# Patient Record
Sex: Female | Born: 1990
Health system: Southern US, Community
[De-identification: ages and names within clinical notes are randomized; demographics above are authoritative.]

## PROBLEM LIST (undated history)

## (undated) ENCOUNTER — Inpatient Hospital Stay (HOSPITAL_COMMUNITY): Payer: Self-pay

## (undated) DIAGNOSIS — M419 Scoliosis, unspecified: Secondary | ICD-10-CM

---

## 2013-12-09 ENCOUNTER — Emergency Department (HOSPITAL_BASED_OUTPATIENT_CLINIC_OR_DEPARTMENT_OTHER)
Admission: EM | Admit: 2013-12-09 | Discharge: 2013-12-09 | Disposition: A | Discharge status: Home / Self Care | Payer: Medicaid - Out of State | Attending: Emergency Medicine | Admitting: Emergency Medicine

## 2013-12-09 ENCOUNTER — Emergency Department (HOSPITAL_BASED_OUTPATIENT_CLINIC_OR_DEPARTMENT_OTHER): Payer: Medicaid - Out of State

## 2013-12-09 ENCOUNTER — Encounter (HOSPITAL_BASED_OUTPATIENT_CLINIC_OR_DEPARTMENT_OTHER): Payer: Self-pay | Admitting: Emergency Medicine

## 2013-12-09 DIAGNOSIS — J111 Influenza due to unidentified influenza virus with other respiratory manifestations: Secondary | ICD-10-CM

## 2013-12-09 DIAGNOSIS — IMO0001 Reserved for inherently not codable concepts without codable children: Secondary | ICD-10-CM | POA: Diagnosis not present

## 2013-12-09 DIAGNOSIS — R509 Fever, unspecified: Secondary | ICD-10-CM | POA: Diagnosis present

## 2013-12-09 MED ORDER — OSELTAMIVIR PHOSPHATE 75 MG PO CAPS: 75.0000 mg | ORAL_CAPSULE | Freq: Two times a day (BID) | ORAL | Status: DC

## 2013-12-09 MED ORDER — KETOROLAC TROMETHAMINE 60 MG/2ML IM SOLN
INTRAMUSCULAR | Status: AC
  Filled 2013-12-09: qty 2

## 2013-12-09 MED ORDER — KETOROLAC TROMETHAMINE 30 MG/ML IJ SOLN
60.0000 mg | Freq: Once | INTRAMUSCULAR | Status: AC
  Administered 2013-12-09: 60 mg via INTRAMUSCULAR

## 2013-12-09 MED ORDER — IBUPROFEN 600 MG PO TABS: 600.0000 mg | ORAL_TABLET | Freq: Four times a day (QID) | ORAL | Status: DC | PRN

## 2013-12-09 MED ORDER — ACETAMINOPHEN 325 MG PO TABS
650.0000 mg | ORAL_TABLET | Freq: Once | ORAL | Status: AC
  Administered 2013-12-09: 650 mg via ORAL
  Filled 2013-12-09: qty 2

## 2013-12-09 NOTE — ED Provider Notes (Signed)
CSN: [id number]     Arrival date & time 12/09/13  1330 History   First MD Initiated Contact with Patient 12/09/13 1412     Chief Complaint  Patient presents with  . Fever   (Consider location/radiation/quality/duration/timing/severity/associated sxs/prior Treatment) Patient is a 23 y.o. female presenting with fever. The history is provided by the patient.  Fever Max temp prior to arrival:  102.6 Temp source:  Oral Severity:  Moderate Onset quality:  Sudden Timing:  Constant Progression:  Worsening Chronicity:  New Relieved by:  Nothing Worsened by:  Nothing tried Associated symptoms: myalgias   Associated symptoms: no cough and no vomiting     History reviewed. No pertinent past medical history. History reviewed. No pertinent past surgical history. No family history on file. History  Substance Use Topics  . Smoking status: Never Smoker   . Smokeless tobacco: Not on file  . Alcohol Use: No   OB History   Grav Para Term Preterm Abortions TAB SAB Ect Mult Living                 Review of Systems  Constitutional: Positive for fever.  Respiratory: Negative for cough and shortness of breath.   Gastrointestinal: Negative for vomiting and abdominal pain.  Musculoskeletal: Positive for myalgias.  All other systems reviewed and are negative.    Allergies  Review of patient's allergies indicates no known allergies.  Home Medications   Current Outpatient Rx  Name  Route  Sig  Dispense  Refill  . ibuprofen (ADVIL,MOTRIN) 600 MG tablet   Oral   Take 1 tablet (600 mg total) by mouth every 6 (six) hours as needed.   30 tablet   0   . oseltamivir (TAMIFLU) 75 MG capsule   Oral   Take 1 capsule (75 mg total) by mouth every 12 (twelve) hours.   10 capsule   0    BP 109/66  Pulse 113  Temp(Src) 102.6 F (39.2 C) (Oral)  Resp 18  Ht 5\' 3"  (1.6 m)  Wt 120 lb (54.432 kg)  BMI 21.26 kg/m2  SpO2 100% Physical Exam  Nursing note and vitals reviewed. Constitutional:  She is oriented to person, place, and time. She appears well-developed and well-nourished. No distress.  HENT:  Head: Normocephalic and atraumatic.  Right Ear: Tympanic membrane normal.  Left Ear: Tympanic membrane normal.  Eyes: EOM are normal. Pupils are equal, round, and reactive to light.  Neck: Normal range of motion. Neck supple.  Cardiovascular: Normal rate and regular rhythm.  Exam reveals no friction rub.   No murmur heard. Pulmonary/Chest: Effort normal and breath sounds normal. No respiratory distress. She has no wheezes. She has no rales.  Abdominal: Soft. She exhibits no distension. There is no tenderness. There is no rebound.  Musculoskeletal: Normal range of motion. She exhibits no edema.  Neurological: She is alert and oriented to person, place, and time.  Skin: She is not diaphoretic.    ED Course  Procedures (including critical care time) Labs Review Labs Reviewed - No data to display Imaging Review Dg Chest 2 View  12/09/2013   CLINICAL DATA:  Cough, sore throat, fever  EXAM: CHEST  2 VIEW  COMPARISON:  None  FINDINGS: Normal cardiac silhouette and mediastinal contours. The lungs appear mildly hyperexpanded. No focal airspace opacities. No pleural effusion or pneumothorax. No evidence of edema. No acute osseus abnormalities.  IMPRESSION: Mild lung hyperexpansion without acute cardiopulmonary disease.   Electronically Signed   By: Simonne Come  M.D.   On: 12/09/2013 14:49    EKG Interpretation   None       MDM   1. Influenza    SUBJECTIVE:  Emily Bennett is a 23 y.o. female who present complaining of flu-like symptoms: fevers, chills, myalgias, congestion, sore throat and cough for 1 days. Denies dyspnea or wheezing.  OBJECTIVE: Appears moderately ill but not toxic; temperature as noted in vitals. Ears normal. Throat and pharynx normal.  Neck supple. No adenopathy in the neck. Sinuses non tender. The chest is clear.  ASSESSMENT: Influenza  PLAN: Tamiflu -  CXR negative for PNA Symptomatic therapy suggested: rest, increase fluids, OTC acetaminophen, ibuprofen and call prn if symptoms persist or worsen. Call or return to clinic prn if these symptoms worsen or fail to improve as anticipated.     Dagmar HaitWilliam Fern Canova, MD 12/09/13 1536

## 2013-12-09 NOTE — ED Notes (Signed)
MD at bedside. 

## 2013-12-09 NOTE — Discharge Instructions (Signed)
Influenza, Adult °Influenza ("the flu") is a viral infection of the respiratory tract. It occurs more often in winter months because people spend more time in close contact with one another. Influenza can make you feel very sick. Influenza easily spreads from person to person (contagious). °CAUSES  °Influenza is caused by a virus that infects the respiratory tract. You can catch the virus by breathing in droplets from an infected person's cough or sneeze. You can also catch the virus by touching something that was recently contaminated with the virus and then touching your mouth, nose, or eyes. °SYMPTOMS  °Symptoms typically last 4 to 10 days and may include: °· Fever. °· Chills. °· Headache, body aches, and muscle aches. °· Sore throat. °· Chest discomfort and cough. °· Poor appetite. °· Weakness or feeling tired. °· Dizziness. °· Nausea or vomiting. °DIAGNOSIS  °Diagnosis of influenza is often made based on your history and a physical exam. A nose or throat swab test can be done to confirm the diagnosis. °RISKS AND COMPLICATIONS °You may be at risk for a more severe case of influenza if you smoke cigarettes, have diabetes, have chronic heart disease (such as heart failure) or lung disease (such as asthma), or if you have a weakened immune system. Elderly people and pregnant women are also at risk for more serious infections. The most common complication of influenza is a lung infection (pneumonia). Sometimes, this complication can require emergency medical care and may be life-threatening. °PREVENTION  °An annual influenza vaccination (flu shot) is the best way to avoid getting influenza. An annual flu shot is now routinely recommended for all adults in the [place] °TREATMENT  °In mild cases, influenza goes away on its own. Treatment is directed at relieving symptoms. For more severe cases, your caregiver may prescribe antiviral medicines to shorten the sickness. Antibiotic medicines are not effective, because the  infection is caused by a virus, not by bacteria. °HOME CARE INSTRUCTIONS °· Only take over-the-counter or prescription medicines for pain, discomfort, or fever as directed by your caregiver. °· Use a cool mist humidifier to make breathing easier. °· Get plenty of rest until your temperature returns to normal. This usually takes 3 to 4 days. °· Drink enough fluids to keep your urine clear or pale yellow. °· Cover your mouth and nose when coughing or sneezing, and wash your hands well to avoid spreading the virus. °· Stay home from work or school until your fever has been gone for at least 1 full day. °SEEK MEDICAL CARE IF:  °· You have chest pain or a deep cough that worsens or produces more mucus. °· You have nausea, vomiting, or diarrhea. °SEEK IMMEDIATE MEDICAL CARE IF:  °· You have difficulty breathing, shortness of breath, or your skin or nails turn bluish. °· You have severe neck pain or stiffness. °· You have a severe headache, facial pain, or earache. °· You have a worsening or recurring fever. °· You have nausea or vomiting that cannot be controlled. °MAKE SURE YOU: °· Understand these instructions. °· Will watch your condition. °· Will get help right away if you are not doing well or get worse. °Document Released: 11/09/2000 Document Revised: 05/13/2012 Document Reviewed: 02/11/2012 °ExitCare® Patient Information ©2014 [employer]. ° ° °Emergency Department Resource Guide °1) Find a Doctor and Pay Out of Pocket °Although you won't have to find out who is covered by your insurance plan, it is a good idea to ask around and get recommendations. You will then need to   call the office and see if the doctor you have chosen will accept you as a new patient and what types of options they offer for patients who are self-pay. Some doctors offer discounts or will set up payment plans for their patients who do not have insurance, but you will need to ask so you aren't surprised when you get to your appointment. ° °2)  Contact Your Local Health Department °Not all health departments have doctors that can see patients for sick visits, but many do, so it is worth a call to see if yours does. If you don't know where your local health department is, you can check in your phone book. The CDC also has a tool to help you locate your state's health department, and many state websites also have listings of all of their local health departments. ° °3) Find a Walk-in Clinic °If your illness is not likely to be very severe or complicated, you may want to try a walk in clinic. These are popping up all over the country in pharmacies, drugstores, and shopping centers. They're usually staffed by nurse practitioners or physician assistants that have been trained to treat common illnesses and complaints. They're usually fairly quick and inexpensive. However, if you have serious medical issues or chronic medical problems, these are probably not your best option. ° °No Primary Care Doctor: °- Call [facility] at  832-8000 - they can help you locate a primary care doctor that  accepts your insurance, provides certain services, etc. °- Physician Referral Service- 1-800-533-3463 ° °Chronic Pain Problems: °Organization         Address  Phone   Notes  °[facility]  (336) 297-2271 Patients need to be referred by their primary care doctor.  ° °Medication Assistance: °Organization         Address  Phone   Notes  °[place] [facility] [address] °[place], [place] [address] (336) 641-8030 --Must be a resident of [place] °-- Must have NO insurance coverage whatsoever (no Medicaid/ Medicare, etc.) °-- The pt. MUST have a primary care doctor that directs their care regularly and follows them in the community °  °[facility]  (866) 331-1348   °[employer]  (888) 892-1162   ° °Agencies that provide inexpensive medical care: °Organization         Address  Phone   Notes  °[facility]   (336) 832-8035   °[facility]    (336) 832-7272   °[facility] [address] °[place], [place] [address] (336) 832-4777   °[facility] [address], °[place] (336) 271-4999   °[facility]    (336) 373-0678   °[facility]    (336) 272-1050   °[facility] ° [address], [place] Phone:  (336) 832-4444, Fax:  (336) 832-4440 Hours of Operation:  9 am - 6 pm, M-F.  Also accepts Medicaid/Medicare and self-pay.  °[facility] ° [address], [place] Phone: (336) 832-3150, Fax: (336) 832-3151. Hours of Operation:  8:30 am - 5:30 pm, M-F.  Also accepts Medicaid and self-pay.  °[facility] [place] [address], [place] Phone: (336) 878-6027   °[facility] [address], [place], [place] (336)723-1848, Ext. 123 Mondays & Thursdays: 7-9 AM.  First 15 patients are seen on a first come, first serve basis. °  ° °  Medicaid-accepting [place] Providers: ° °Organization         Address  Phone   Notes  °[facility] [address], [place] (336) 641-2100 Also accepts self-pay patients.  °[facility] [address], [place] ° (336) 856-9996   °[facility] [address], [place] (336) 288-8857   °[facility] [address] [place] [address], [place] (336) 299-7000   °Veita Bland [address], [place]  ° (336) 373-1557 Only accepts [place] Access Medicaid patients after they have their name applied to their card.  ° °Self-Pay (no insurance) in [place]: ° °Organization         Address  Phone   Notes  °Sickle Cell Patients, [facility] [address], [place] (336) 832-1970   °[facility] [address], [place] (336) 832-4400   ° [facility] ° [address], [place] (336) 992-4800   °[facility]/Dr. Osei-Bonsu ° [address] [place] [address], [place] or [address], [place] (336) 841-8500 Phone number for both [place] and [place] locations is the same.  °[facility] [address], [place] (336) 299-0000   °[facility] [address] [place] [address], [place] or [address] (336) 852-7530 °(336) 878-2260   °[facility] [address], [place] (336) 350-1642, phone; (336) 294-5005, fax Sees patients 1st and 3rd Saturday of every month.  Must not qualify for public or private insurance (i.e. Medicaid, Medicare, [place] Health Choice, Veterans' Benefits) • Household income should be no more than 200% of the poverty level •The clinic cannot treat you if you are pregnant or think you are pregnant • Sexually transmitted diseases are not treated at the clinic.  ° ° °Dental Care: °Organization         Address  Phone  Notes  °[facility] [address], [place] (336) 641-6152 Accepts children up to age 21 who are enrolled in Medicaid or [place] Health Choice; pregnant women with a Medicaid card; and children who have applied for Medicaid or [place] Health Choice, but were declined, whose parents can pay a reduced fee at time of service.  °[facility] [place]  [address], [place] (336) 641-7733 Accepts children up to age 21 who are enrolled in Medicaid or [place] Health Choice; pregnant women with a Medicaid card; and children who have applied for Medicaid or  Health Choice, but were declined, whose parents can pay a reduced fee at time of service.  °[facility] ° [address], [place] (336) 641-4533 Patients are seen by appointment only. Walk-ins are not accepted. [facility] will see patients 18 years of age and older. °Monday - Tuesday  (8am-5pm) °Most Wednesdays (8:30-5pm) °$30 per visit, cash only  °[facility] ° [address], [place] (336) 641-4533 Patients are seen by appointment only. Walk-ins are not accepted. [facility] will see patients 18 years of age and older. °One Wednesday Evening (Monthly: Volunteer Based).  $30 per visit, cash only  °[facility]  (919) 537-3737 for adults; Children under age 4, call Graduate Pediatric Dentistry at (919) [phone]. Children aged 4-14, please call (919) [phone] to request a   pediatric application. ° Dental services are provided in all areas of dental care including fillings, crowns and bridges, complete and partial dentures, implants, gum treatment, root canals, and extractions. Preventive care is also provided. Treatment is provided to both adults and children. °Patients are selected via a lottery and there is often a waiting list. °  °[facility] [address], °[place] ° (336) 763-8833 [email or web address] °  °[facility] [address], [place], [place] (336)723-1848, Ext. 123 Second and Fourth Thursday of each month, opens at 6:30 AM; Clinic ends at 9 AM.  Patients are seen on a first-come first-served basis, and a limited number are seen during each clinic.  ° °[facility] ° [address], [place], [place] (336) 723-7904   Eligibility Requirements °You must have lived in [place], [place], or [place] counties for at least the last three months. °  You cannot be eligible for state or federal sponsored healthcare insurance, including Veterans Administration, Medicaid, or Medicare. °  You generally cannot be eligible for healthcare insurance through your employer.  °  How to apply: °Eligibility screenings are held every Tuesday and Wednesday afternoon from 1:00 pm until 4:00 pm. You do not need an appointment for the interview!  °[facility] [address], [place], [place]  336-631-2330   °[facility]  336-342-8273   °[facility]  336-703-3100   °[facility]  336-570-6415   ° °Behavioral Health Resources in the Community: °Intensive Outpatient Programs °Organization         Address  Phone  Notes  °[facility] [address], [place], [place] 336-878-6098   °[facility] [address], [place], [place] 336-832-9800   °[facility] [address], [place], [place] ° 336-882-2125   °[facility] [address],  °[place], [place] 1-800-853-5163 or 336-641-4981   °Substance Abuse Resources °Organization         Address  Phone  Notes  °[facility]  336-882-2125   °[facility]  336-784-9470   °[facility]  336-285-9073   °[facility]  336-845-3988   °Residential & Outpatient Substance Abuse Program  1-800-659-3381   °Psychological Services °Organization         Address  Phone  Notes  °[facility]  336- 832-9600   °[facility]  336- 378-7881   °[facility] [address], [place] 1-800-853-5163 or 336-641-4981   ° °Mobile Crisis Teams °Organization         Address  Phone  Notes  °[facility], Mobile Crisis Care Unit  1-877-626-1772   °Assertive °[facility] ° [address] [place], [place] 336-834-9664   °Sharon DeEsch [address] °[place] 336-554-5454   ° °Self-Help/Support Groups °Organization         Address  Phone             Notes  °[facility] - variety of support groups  336- 373-1402 Call for more information  °Narcotics Anonymous (NA), [facility] [address], °[place]  2 meetings at this location  ° °Residential Treatment Programs °Organization         Address  Phone  Notes  °[facility] [address],    °[place]   1-866-801-8205   °[facility] ° [address], [place], [place] 704-293-8524   °[facility] [address], [place] 336-845-3988 Admissions: 8am-3pm M-F  °[facility] [address]    °[place], [place] 336-841-1104   °[facility] [address], [place], [place] 336-379-7146   °[facility] [address]  °[place], [place] 336-285-9073   °[facility] Intensive Outpatient [address], [place], [place] 336-852-3033   °[facility] [address]  °[place], [place] 1-877-615-2722 or 336-784-9470   °[facility] [address] [place], [place] 336-227-7417 Accepts Medicaid  °[facility] [address]  °[place] 1-800-659-3381 Substance Abuse/Addiction Treatment  ° °[place] Behavioral Health Resources °Organization         Address  Phone  Notes  °[facility]  (888) 581-9988   °Julie Brannon, PhD [address] [place], [place]   (336) 349-5553 or (336) 951-0000   °[facility]   [address] °[place], [place] (336) 349-4454   °[facility] [address], [place], [place] (336) 342-8316 Insurance/Medicaid/sponsorship through [facility]  °[facility] [address]                                    [place], [place] (336) 342-8316 Therapy/tele-psych/case  °[facility] [address]  ° [place], [place] (336) 349-2233    °Dr. Arfeen  (336) 349-4544   °[facility] [place]  [employer] [place] [facility] 1) [address], [place] °2) [address], [place] °3)  [address], [place] (336) 349-3220 °(336) 342-7768 ° °(336) 342-8140   °[place] [facility] (336) 342-1394 or (336) 342-3537 (After Hours)    ° ° ° °

## 2013-12-09 NOTE — ED Notes (Signed)
Fever, sore throat, cough, right leg pain

## 2014-05-08 ENCOUNTER — Emergency Department (HOSPITAL_BASED_OUTPATIENT_CLINIC_OR_DEPARTMENT_OTHER)
Admission: EM | Admit: 2014-05-08 | Discharge: 2014-05-08 | Disposition: A | Discharge status: Home / Self Care | Payer: Medicaid - Out of State | Attending: Emergency Medicine | Admitting: Emergency Medicine

## 2014-05-08 ENCOUNTER — Encounter (HOSPITAL_BASED_OUTPATIENT_CLINIC_OR_DEPARTMENT_OTHER): Payer: Self-pay | Admitting: Emergency Medicine

## 2014-05-08 DIAGNOSIS — K089 Disorder of teeth and supporting structures, unspecified: Secondary | ICD-10-CM | POA: Diagnosis not present

## 2014-05-08 DIAGNOSIS — Z792 Long term (current) use of antibiotics: Secondary | ICD-10-CM | POA: Insufficient documentation

## 2014-05-08 DIAGNOSIS — G8918 Other acute postprocedural pain: Secondary | ICD-10-CM | POA: Diagnosis not present

## 2014-05-08 DIAGNOSIS — T85848A Pain due to other internal prosthetic devices, implants and grafts, initial encounter: Secondary | ICD-10-CM

## 2014-05-08 MED ORDER — TRAMADOL HCL 50 MG PO TABS: 50.0000 mg | ORAL_TABLET | Freq: Once | ORAL | Status: DC

## 2014-05-08 MED ORDER — PENICILLIN V POTASSIUM 250 MG PO TABS
500.0000 mg | ORAL_TABLET | Freq: Once | ORAL | Status: AC
  Administered 2014-05-08: 500 mg via ORAL
  Filled 2014-05-08: qty 2

## 2014-05-08 MED ORDER — TRAMADOL HCL 50 MG PO TABS
50.0000 mg | ORAL_TABLET | Freq: Once | ORAL | Status: AC
  Administered 2014-05-08: 50 mg via ORAL
  Filled 2014-05-08: qty 1

## 2014-05-08 MED ORDER — PENICILLIN V POTASSIUM 500 MG PO TABS: 500.0000 mg | ORAL_TABLET | Freq: Four times a day (QID) | ORAL | Status: DC

## 2014-05-08 NOTE — ED Provider Notes (Signed)
Medical screening examination/treatment/procedure(s) were performed by non-physician practitioner and as supervising physician I was immediately available for consultation/collaboration.     Geoffery Lyonsouglas Elice Crigger, MD 05/08/14 [phone]

## 2014-05-08 NOTE — ED Notes (Signed)
C/o right upper wisdom tooth discomfort for several days.

## 2014-05-08 NOTE — Discharge Instructions (Signed)
You have a  partially erupted right bottom wisdom tooth with surrounding gum swelling, and tenderness.  You have  been placed on an antibiotic.  Please take this as directed 4 times a day for 10, days.  You've also been given Ultram that, you can take in addition to ibuprofen for pain, control.  You've also been given a referral to a dentist.  Please call first thing on Monday morning telling them that you were referred through the emergency department.  We will make every effort to see, you in a timely manner

## 2014-05-08 NOTE — ED Provider Notes (Signed)
CSN: [id number]     Arrival date & time 05/08/14  2112 History   First MD Initiated Contact with Patient 05/08/14 2120     Chief Complaint  Patient presents with  . Dental Pain     (Consider location/radiation/quality/duration/timing/severity/associated sxs/prior Treatment) HPI Comments: Patient has a partially erupted wisdom tooth right lower that has been bothering her for several days.  Been taking over-the-counter ibuprofen, without relief  Patient is a 23 y.o. female presenting with tooth pain. The history is provided by the patient.  Dental Pain Location:  Lower Lower teeth location:  32/RL 3rd molar Quality:  Aching Severity:  Moderate Timing:  Constant Associated symptoms: no fever     History reviewed. No pertinent past medical history. History reviewed. No pertinent past surgical history. No family history on file. History  Substance Use Topics  . Smoking status: Never Smoker   . Smokeless tobacco: Not on file  . Alcohol Use: No   OB History   Grav Para Term Preterm Abortions TAB SAB Ect Mult Living                 Review of Systems  Constitutional: Negative for fever.  HENT: Positive for dental problem.   Skin: Negative for rash and wound.  All other systems reviewed and are negative.     Allergies  Review of patient's allergies indicates no known allergies.  Home Medications   Prior to Admission medications   Medication Sig Start Date End Date Taking? Authorizing Provider  ibuprofen (ADVIL,MOTRIN) 600 MG tablet Take 1 tablet (600 mg total) by mouth every 6 (six) hours as needed. 12/09/13   Dagmar HaitWilliam Blair Walden, MD  oseltamivir (TAMIFLU) 75 MG capsule Take 1 capsule (75 mg total) by mouth every 12 (twelve) hours. 12/09/13   Dagmar HaitWilliam Blair Walden, MD  penicillin v potassium (VEETID) 500 MG tablet Take 1 tablet (500 mg total) by mouth 4 (four) times daily. 05/08/14   Arman FilterGail K Eliya Bubar, NP  traMADol (ULTRAM) 50 MG tablet Take 1 tablet (50 mg total) by mouth  once. 05/08/14   Arman FilterGail K Shaylea Ucci, NP   BP 145/83  Pulse 80  Temp(Src) 98.7 F (37.1 C) (Oral)  Resp 16  Ht 5\' 3"  (1.6 m)  Wt 128 lb (58.06 kg)  BMI 22.68 kg/m2  SpO2 100%  LMP 04/07/2014 Physical Exam  Nursing note and vitals reviewed. Constitutional: She appears well-developed and well-nourished.  HENT:  Mouth/Throat:    Eyes: Pupils are equal, round, and reactive to light.  Neck: Normal range of motion.  Cardiovascular: Normal rate.   Pulmonary/Chest: Effort normal.  Lymphadenopathy:    She has no cervical adenopathy.  Skin: Skin is warm.    ED Course  Procedures (including critical care time) Labs Review Labs Reviewed - No data to display  Imaging Review No results found.   EKG Interpretation None      MDM  Patient is a partially erupted wisdom tooth with surrounding erythema.  Started on penicillin and Ultram for pain, control.  She's also been given a referral to the dentist with instructions to call first thing Monday morning to set an appointment Final diagnoses:  Dental implant pain         Arman FilterGail K Taylin Leder, NP 05/08/14 2206

## 2014-05-17 ENCOUNTER — Emergency Department (HOSPITAL_COMMUNITY)
Admission: EM | Admit: 2014-05-17 | Discharge: 2014-05-17 | Disposition: A | Discharge status: Home / Self Care | Payer: Medicaid - Out of State | Attending: Emergency Medicine | Admitting: Emergency Medicine

## 2014-05-17 DIAGNOSIS — Z79899 Other long term (current) drug therapy: Secondary | ICD-10-CM | POA: Insufficient documentation

## 2014-05-17 DIAGNOSIS — K089 Disorder of teeth and supporting structures, unspecified: Secondary | ICD-10-CM | POA: Diagnosis present

## 2014-05-17 DIAGNOSIS — R110 Nausea: Secondary | ICD-10-CM | POA: Insufficient documentation

## 2014-05-17 DIAGNOSIS — Z792 Long term (current) use of antibiotics: Secondary | ICD-10-CM | POA: Diagnosis not present

## 2014-05-17 DIAGNOSIS — K0889 Other specified disorders of teeth and supporting structures: Secondary | ICD-10-CM

## 2014-05-17 MED ORDER — IBUPROFEN 800 MG PO TABS: 800.0000 mg | ORAL_TABLET | Freq: Three times a day (TID) | ORAL | Status: DC

## 2014-05-17 NOTE — ED Notes (Signed)
Declined W/C at D/C and was escorted to lobby by RN. 

## 2014-05-17 NOTE — Discharge Instructions (Signed)
Dental Pain °Toothache is pain in or around a tooth. It may get worse with chewing or with cold or heat.  °HOME CARE °· Your dentist may use a numbing medicine during treatment. If so, you may need to avoid eating until the medicine wears off. Ask your dentist about this. °· Only take medicine as told by your dentist or doctor. °· Avoid chewing food near the painful tooth until after all treatment is done. Ask your dentist about this. °GET HELP RIGHT AWAY IF:  °· The problem gets worse or new problems appear. °· You have a fever. °· There is redness and puffiness (swelling) of the face, jaw, or neck. °· You cannot open your mouth. °· There is pain in the jaw. °· There is very bad pain that is not helped by medicine. °MAKE SURE YOU:  °· Understand these instructions. °· Will watch your condition. °· Will get help right away if you are not doing well or get worse. °Document Released: 04/30/2008 Document Revised: 02/04/2012 Document Reviewed: 04/30/2008 °ExitCare® Patient Information ©2015 [employer]. This information is not intended to replace advice given to you by your health care provider. Make sure you discuss any questions you have with your health care provider. ° °Return to the emergency room for fever, change in vision, redness to the face that rapidly spreads towards the eye, nausea or vomiting, difficulty swallowing or shortness of breath. ° °You may follow with the dentist listed above. Alternatively,  you may also contact David and Janna Civils who run a low-cost dental clinic at their phone number is [phone]. You may also call [phone] ° °Dental Assistance °If the dentist on-call cannot see you, please use the resources below: ° ° °Patients with Medicaid: [facility] °[address], [phone] °[address], [phone] ° °If unable to pay, or uninsured, contact [facility] ([phone]) or [facility] ([phone] in [place], [phone] in  [place]) to become qualified for the adult dental clinic ° °Other Low-Cost Community Dental Services: °[facility]- [address], [place], [place], [address] °   [phone], Ext. [phone] °   2nd and 4th Thursday of the month at 6:30am °   10 clients each day by appointment, can sometimes see walk-in     patients if someone does not show for an appointment °[facility]- [address], [place], [place], [address] °   [phone] °[facility]- [address], [place], [place], [address] °   [phone] ° °[facility]- [phone] °[facility]- [phone] °[facility]- [phone] ° °

## 2014-05-17 NOTE — ED Provider Notes (Signed)
CSN: [id number]     Arrival date & time 05/17/14  1732 History  This chart was scribed for non-physician practitioner, Junious SilkHannah Merrell PA-C, working with Glynn OctaveStephen Rancour, MD by Milly JakobJohn Lee Graves, ED Scribe. The patient was seen in room TR04C/TR04C. Patient's care was started at 7:29 PM.   Chief Complaint  Patient presents with  . Dental Pain   The history is provided by the patient and a relative. No language interpreter was used.   HPI Comments: Emily Bennett is a 23 y.o. female who presents to the Emergency Department complaining of constant, aching right lower dental pain. She rates the pain a 9/10. Patient states that she has an impacted wisdom tooth for which she has been taking PCN and Tramadol. Patient reports taking Ibuprofen 600 mg with little relief. She states that she took Ibuprofen 800mg  with relief. Her relative reports that she became nauseated after taking Tramadol. The patient states that she would like pain medication, and requests Ibuprofen 800mg  instead of narcotics to avoid nausea.  She denies associated fever, chills, and vomiting. She reports she has been unable to see a dentist due to a lack of health insurance.   No past medical history on file. No past surgical history on file. No family history on file. History  Substance Use Topics  . Smoking status: Never Smoker   . Smokeless tobacco: Not on file  . Alcohol Use: No   OB History   Grav Para Term Preterm Abortions TAB SAB Ect Mult Living                 Review of Systems  Constitutional: Negative for fever and chills.  HENT: Positive for dental problem (right lower).   Gastrointestinal: Positive for nausea. Negative for vomiting.  All other systems reviewed and are negative.     Allergies  Review of patient's allergies indicates no known allergies.  Home Medications   Prior to Admission medications   Medication Sig Start Date End Date Taking? Authorizing Camey Edell  ibuprofen (ADVIL,MOTRIN) 600 MG  tablet Take 1 tablet (600 mg total) by mouth every 6 (six) hours as needed. 12/09/13   Dagmar HaitWilliam Blair Walden, MD  oseltamivir (TAMIFLU) 75 MG capsule Take 1 capsule (75 mg total) by mouth every 12 (twelve) hours. 12/09/13   Dagmar HaitWilliam Blair Walden, MD  penicillin v potassium (VEETID) 500 MG tablet Take 1 tablet (500 mg total) by mouth 4 (four) times daily. 05/08/14   Arman FilterGail K Schulz, NP  traMADol (ULTRAM) 50 MG tablet Take 1 tablet (50 mg total) by mouth once. 05/08/14   Arman FilterGail K Schulz, NP   Triage Vitals: BP 104/57  Pulse 77  Temp(Src) 99 F (37.2 C) (Oral)  Resp 18  SpO2 98%  LMP 04/07/2014 Physical Exam  Nursing note and vitals reviewed. Constitutional: She is oriented to person, place, and time. She appears well-developed and well-nourished. No distress.  HENT:  Head: Normocephalic and atraumatic.  Right Ear: External ear normal.  Left Ear: External ear normal.  Nose: Nose normal.  Mouth/Throat: Oropharynx is clear and moist.  No trismus, submental edema, or tongue elevation. Right lower wisdom tooth erupting, no surrounding erythema or inflammation of gum.   Eyes: Conjunctivae are normal.  Neck: Normal range of motion.  Cardiovascular: Normal rate, regular rhythm and normal heart sounds.   Pulmonary/Chest: Effort normal and breath sounds normal. No stridor. No respiratory distress. She has no wheezes. She has no rales.  Abdominal: Soft. She exhibits no distension.  Musculoskeletal: Normal  range of motion.  Neurological: She is alert and oriented to person, place, and time. She has normal strength.  Skin: Skin is warm and dry. She is not diaphoretic. No erythema.  Psychiatric: She has a normal mood and affect. Her behavior is normal.    ED Course  Procedures (including critical care time) DIAGNOSTIC STUDIES: Oxygen Saturation is 98% on room air, normal by my interpretation.    COORDINATION OF CARE: 7:35 PM-Discussed treatment plan which includes Ibuprofen 800  with pt at bedside and  pt agreed to plan. Discussed dental care options with patient.   Labs Review Labs Reviewed - No data to display  Imaging Review No results found.   EKG Interpretation None      MDM   Final diagnoses:  Pain, dental    Patient with toothache.  No gross abscess.  Exam unconcerning for Ludwig's angina or spread of infection.  Encouraged patient to continue taking PCN and she was given an rx for Ibuprofen 800mg .  Urged patient to follow-up with dentist.  Dental resource guide given.   I personally performed the services described in this documentation, which was scribed in my presence. The recorded information has been reviewed and is accurate.    Mora BellmanHannah S Merrell, PA-C 05/17/14 2144

## 2014-05-17 NOTE — ED Notes (Signed)
[place] dental care but doesn't have coverage. Pt states that her R bottom wisdom tooth is hurting 9/10 ache. Worse if pushed on.

## 2014-05-17 NOTE — ED Provider Notes (Signed)
Medical screening examination/treatment/procedure(s) were performed by non-physician practitioner and as supervising physician I was immediately available for consultation/collaboration.   EKG Interpretation None        Glynn OctaveStephen Rancour, MD 05/17/14 [phone]

## 2015-06-21 ENCOUNTER — Encounter (HOSPITAL_BASED_OUTPATIENT_CLINIC_OR_DEPARTMENT_OTHER): Payer: Self-pay | Admitting: *Deleted

## 2015-06-21 ENCOUNTER — Emergency Department (HOSPITAL_BASED_OUTPATIENT_CLINIC_OR_DEPARTMENT_OTHER)
Admission: EM | Admit: 2015-06-21 | Discharge: 2015-06-21 | Disposition: A | Discharge status: Home / Self Care | Payer: PRIVATE HEALTH INSURANCE | Attending: Emergency Medicine | Admitting: Emergency Medicine

## 2015-06-21 DIAGNOSIS — H109 Unspecified conjunctivitis: Secondary | ICD-10-CM | POA: Insufficient documentation

## 2015-06-21 DIAGNOSIS — Z791 Long term (current) use of non-steroidal anti-inflammatories (NSAID): Secondary | ICD-10-CM | POA: Insufficient documentation

## 2015-06-21 MED ORDER — CIPROFLOXACIN HCL 0.3 % OP SOLN
1.0000 [drp] | OPHTHALMIC | Status: DC
  Administered 2015-06-21: 1 [drp] via OPHTHALMIC
  Filled 2015-06-21: qty 2.5

## 2015-06-21 MED ORDER — ARTIFICIAL TEARS OP OINT
TOPICAL_OINTMENT | OPHTHALMIC | Status: DC | PRN
  Administered 2015-06-21: 12:00:00 via OPHTHALMIC
  Filled 2015-06-21: qty 3.5

## 2015-06-21 NOTE — ED Notes (Signed)
Patient states five days ago, she developed bilateral itching in her eyes.  States it felt like something was in her eyes.  States she has also had intermittent crusted mucus for the last three days.  Has used otc natural tears and redness reducer.  Denies any vision changes.

## 2015-06-21 NOTE — ED Provider Notes (Signed)
CSN: [id number]     Arrival date & time 06/21/15  1142 History   First MD Initiated Contact with Patient 06/21/15 1149     Chief Complaint  Patient presents with  . Eye Problem     (Consider location/radiation/quality/duration/timing/severity/associated sxs/prior Treatment) Patient is a 24 y.o. female presenting with eye problem. The history is provided by the patient.  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 She noticed on Friday she started feeling itchy medicine scratchiness of her left eye which continually worsened and then 2 days ago moved into her right eye as well Relieved by:  Nothing Worsened by:  Nothing tried Ineffective treatments: Allergy eyedrops. Associated symptoms: crusting, foreign body sensation, inflammation, itching and redness   Associated symptoms: no blurred vision, no decreased vision, no photophobia and no swelling     History reviewed. No pertinent past medical history. History reviewed. No pertinent past surgical history. No family history on file. History  Substance Use Topics  . Smoking status: Never Smoker   . Smokeless tobacco: Not on file  . Alcohol Use: No   OB History    No data available     Review of Systems  Eyes: Positive for redness and itching. Negative for blurred vision and photophobia.  All other systems reviewed and are negative.     Allergies  Review of patient's allergies indicates no known allergies.  Home Medications   Prior to Admission medications   Medication Sig Start Date End Date Taking? Authorizing Provider  ibuprofen (ADVIL,MOTRIN) 800 MG tablet Take 800 mg by mouth every 8 (eight) hours as needed for moderate  pain.    Historical Provider, MD  ibuprofen (ADVIL,MOTRIN) 800 MG tablet Take 1 tablet (800 mg total) by mouth 3 (three) times daily. 05/17/14   Junious Silk, PA-C  penicillin v potassium (VEETID) 500 MG tablet Take 500 mg by mouth 4 (four) times daily. Started course of medication two weeks ago from today 05-17-14 05/08/14   Earley Favor, NP  traMADol (ULTRAM) 50 MG tablet Take 50 mg by mouth once. 05/08/14   Earley Favor, NP   BP 111/60 mmHg  Pulse 80  Temp(Src) 98.7 F (37.1 C) (Oral)  Resp 18  Ht 5\' 3"  (1.6 m)  Wt 130 lb (58.968 kg)  BMI 23.03 kg/m2  SpO2 99%  LMP 05/28/2015 Physical Exam  Constitutional: She is oriented to person, place, and time. She appears well-developed and well-nourished. No distress.  HENT:  Head: Normocephalic and atraumatic.  Eyes: EOM are normal. Pupils are equal, round, and reactive to light. Right eye exhibits discharge. Right eye exhibits no exudate. Left eye exhibits discharge. Left eye exhibits no exudate. Right conjunctiva is injected. Right conjunctiva has no hemorrhage. Left conjunctiva is injected. Left conjunctiva has no hemorrhage. Pupils are equal.  Tearing present in both eyes but no discharge or matting currently  Cardiovascular: Normal rate.   Pulmonary/Chest: Effort normal.  Neurological: She is alert and oriented to person, place, and time.  Skin: Skin is warm and dry.  Psychiatric: She has a normal mood and affect. Her behavior is normal.  Nursing note and vitals reviewed.   ED Course  Procedures (including critical care time)  Labs Review Labs Reviewed - No data to display  Imaging Review No results found.   EKG Interpretation None      MDM   Final diagnoses:  Bilateral conjunctivitis    Patient with evidence of uncomplicated bilateral bacterial conjunctivitis. She is a noncontact user and has normal vision. She was treated with Cipro eyedrops and artificial tears.    Gwyneth Sprout, MD 06/21/15 1200

## 2015-06-21 NOTE — Discharge Instructions (Signed)

## 2015-06-21 NOTE — ED Notes (Signed)
D/c home with eyes drops given with instructions for home use

## 2017-11-08 ENCOUNTER — Emergency Department (HOSPITAL_BASED_OUTPATIENT_CLINIC_OR_DEPARTMENT_OTHER)
Admission: EM | Admit: 2017-11-08 | Discharge: 2017-11-08 | Disposition: A | Discharge status: Home / Self Care | Payer: PRIVATE HEALTH INSURANCE | Attending: Emergency Medicine | Admitting: Emergency Medicine

## 2017-11-08 ENCOUNTER — Encounter (HOSPITAL_BASED_OUTPATIENT_CLINIC_OR_DEPARTMENT_OTHER): Payer: Self-pay | Admitting: *Deleted

## 2017-11-08 ENCOUNTER — Other Ambulatory Visit: Payer: Self-pay

## 2017-11-08 DIAGNOSIS — H1013 Acute atopic conjunctivitis, bilateral: Secondary | ICD-10-CM

## 2017-11-08 DIAGNOSIS — R067 Sneezing: Secondary | ICD-10-CM | POA: Insufficient documentation

## 2017-11-08 DIAGNOSIS — R0981 Nasal congestion: Secondary | ICD-10-CM | POA: Insufficient documentation

## 2017-11-08 MED ORDER — CETIRIZINE HCL 10 MG PO TABS: 10.0000 mg | ORAL_TABLET | Freq: Every day | ORAL | 0 refills | Status: DC

## 2017-11-08 MED ORDER — FLUTICASONE PROPIONATE 50 MCG/ACT NA SUSP: 1.0000 | Freq: Every day | NASAL | 0 refills | Status: DC

## 2017-11-08 MED ORDER — CARBOXYMETHYLCELLULOSE SODIUM 1 % OP SOLN: 1.0000 [drp] | Freq: Three times a day (TID) | OPHTHALMIC | 12 refills | Status: DC

## 2017-11-08 MED FILL — FLUTICASONE PROP 50 MCG SPR: 50 | 60 days supply | Qty: 16 | Fill #0

## 2017-11-08 MED FILL — CETIRIZINE HCL 10 MG TABS: 10 | 100 days supply | Qty: 100 | Fill #0

## 2017-11-08 NOTE — ED Triage Notes (Signed)
Both eyes are red, drainage, and itching.

## 2017-11-08 NOTE — ED Notes (Signed)
ED Provider at bedside. 

## 2017-11-08 NOTE — Discharge Instructions (Signed)
Take cetirizine and Flonase daily. Use eyedrops up to 3 times a day as needed for eye irritation.  Do not touch the dropper to the eye, as this can cause spread of infection. Try not to itch, irritate, or touch your eyes. Wash your hands frequently to prevent spread of infection. Follow-up with the eye doctor next week if your symptoms are not improving. Return to the emergency room if you develop fevers, worsening symptoms, pain with movement of your eyes, vision changes, or any new or concerning symptoms.

## 2017-11-08 NOTE — ED Provider Notes (Signed)
[facility] EMERGENCY DEPARTMENT Provider Note   CSN: [id number] Arrival date & time: 11/08/17  1113     History   Chief Complaint Chief Complaint  Patient presents with  . Eye Problem    HPI Emily Bennett is a 26 y.o. female presenting with 3-day history of eye redness and irritation.  Patient states that for the past several days, she has had eye irritation.  It began in the right eye, and soon after was in the left eye.  She reports symptoms are worse on the right.  Her eyes are red, itchy, and feel "heavy."  She denies foreign body in her eye.  She denies vision changes.  She reports associated nasal congestion and sneezing.  She has a history of seasonal allergies, she does not take anything for this.  She is tried over-the-counter eyedrops without improvement of her symptoms.  She states that when she woke up, her eyes were crusted shut, but they are not draining throughout the day.  She denies fevers, chills, ear pain, sinus pain or pressure, sore throat, cough, chest pain, shortness of breath.  She has no other medical problems, does not take medications daily.  She denies sick contacts (including pinkeye). She does not wear contacts or glasses.   HPI  History reviewed. No pertinent past medical history.  There are no active problems to display for this patient.   History reviewed. No pertinent surgical history.  OB History    No data available       Home Medications    Prior to Admission medications   Medication Sig Start Date End Date Taking? Authorizing Provider  carboxymethylcellulose 1 % ophthalmic solution Apply 1 drop to eye 3 (three) times daily. 11/08/17   Dmetrius Ambs, PA-C  cetirizine (ZYRTEC) 10 MG tablet Take 1 tablet (10 mg total) by mouth daily. 11/08/17   Wilfrid Hyser, PA-C  fluticasone (FLONASE) 50 MCG/ACT nasal spray Place 1 spray into both nostrils daily. 11/08/17   Ephriam Turman, PA-C  ibuprofen (ADVIL,MOTRIN) 800 MG  tablet Take 800 mg by mouth every 8 (eight) hours as needed for moderate pain.    [provider]  ibuprofen (ADVIL,MOTRIN) 800 MG tablet Take 1 tablet (800 mg total) by mouth 3 (three) times daily. 05/17/14   Junious SilkMerrell, Hannah, PA-C  penicillin v potassium (VEETID) 500 MG tablet Take 500 mg by mouth 4 (four) times daily. Started course of medication two weeks ago from today 05-17-14 05/08/14   Earley FavorSchulz, Gail, NP  traMADol (ULTRAM) 50 MG tablet Take 50 mg by mouth once. 05/08/14   Earley FavorSchulz, Gail, NP    Family History No family history on file.  Social History Social History   Tobacco Use  . Smoking status: Never Smoker  . Smokeless tobacco: Never Used  Substance Use Topics  . Alcohol use: No  . Drug use: No     Allergies   Patient has no known allergies.   Review of Systems Review of Systems  Constitutional: Negative for chills and fever.  HENT: Positive for congestion and sneezing.   Eyes: Positive for discharge, redness and itching. Negative for photophobia and visual disturbance.     Physical Exam Updated Vital Signs BP 114/74 (BP Location: Right Arm)   Pulse 70   Temp 98 F (36.7 C) (Oral)   Resp 18   Ht 5\' 3"  (1.6 m)   Wt 61.7 kg (136 lb)   LMP 10/17/2017   SpO2 100%   BMI 24.09 kg/m  Physical Exam  Constitutional: She is oriented to person, place, and time. She appears well-developed and well-nourished. No distress.  HENT:  Head: Normocephalic and atraumatic.  Right Ear: Tympanic membrane, external ear and ear canal normal.  Left Ear: Tympanic membrane, external ear and ear canal normal.  Nose: Mucosal edema present. Right sinus exhibits no maxillary sinus tenderness and no frontal sinus tenderness. Left sinus exhibits no maxillary sinus tenderness and no frontal sinus tenderness.  Mouth/Throat: Uvula is midline, oropharynx is clear and moist and mucous membranes are normal. No tonsillar exudate.  Nasal mucosal edema.  OP clear without tonsillar swelling  or exudate.  Eyes: EOM and lids are normal. Pupils are equal, round, and reactive to light. Right eye exhibits no chemosis and no discharge. No foreign body present in the right eye. Left eye exhibits no chemosis and no discharge. No foreign body present in the left eye. Right conjunctiva is injected. Left conjunctiva is injected.  bilateral injected conjunctiva.  PERRL and EOMI.  Neck: Normal range of motion.  Cardiovascular: Normal rate, regular rhythm and intact distal pulses.  Pulmonary/Chest: Effort normal and breath sounds normal. She has no decreased breath sounds. She has no wheezes. She has no rhonchi. She has no rales.  Abdominal: She exhibits no distension.  Musculoskeletal: Normal range of motion.  Lymphadenopathy:    She has no cervical adenopathy.  Neurological: She is alert and oriented to person, place, and time.  Skin: Skin is warm.  Psychiatric: She has a normal mood and affect.  Nursing note and vitals reviewed.    ED Treatments / Results  Labs (all labs ordered are listed, but only abnormal results are displayed) Labs Reviewed - No data to display  EKG  EKG Interpretation None       Radiology No results found.  Procedures Procedures (including critical care time)  Medications Ordered in ED Medications - No data to display   Initial Impression / Assessment and Plan / ED Course  I have reviewed the triage vital signs and the nursing notes.  Pertinent labs & imaging results that were available during my care of the patient were reviewed by me and considered in my medical decision making (see chart for details).     Patient presenting with 3-day history of eye irritation and redness.  Physical exam reassuring, shows injected conjunctiva without pain with eye movement or obvious drainage.  As patient has associated nasal congestion sneezing and a history of allergies, likely allergic conjunctivitis.  Discussed findings with patient.  Discussed treatment  for allergies and lubricating eyedrops for irritation.  Discussed follow-up with ophthalmology as needed.  At this time, patient appears safe for discharge.  Return precautions given.  Patient states she understands and agrees to plan.   Final Clinical Impressions(s) / ED Diagnoses   Final diagnoses:  Allergic conjunctivitis of both eyes    ED Discharge Orders        Ordered    fluticasone (FLONASE) 50 MCG/ACT nasal spray  Daily     11/08/17 1236    cetirizine (ZYRTEC) 10 MG tablet  Daily     11/08/17 1236    carboxymethylcellulose 1 % ophthalmic solution  3 times daily     11/08/17 1236       Anayia Eugene, [place] Creek[place], PA-C 11/08/17 1729    Linwood DibblesKnapp, Jon, MD 11/09/17 [phone]

## 2017-11-10 ENCOUNTER — Encounter (HOSPITAL_BASED_OUTPATIENT_CLINIC_OR_DEPARTMENT_OTHER): Payer: Self-pay | Admitting: *Deleted

## 2017-11-10 ENCOUNTER — Emergency Department (HOSPITAL_BASED_OUTPATIENT_CLINIC_OR_DEPARTMENT_OTHER)
Admission: EM | Admit: 2017-11-10 | Discharge: 2017-11-10 | Disposition: A | Discharge status: Home / Self Care | Payer: PRIVATE HEALTH INSURANCE | Attending: Emergency Medicine | Admitting: Emergency Medicine

## 2017-11-10 ENCOUNTER — Other Ambulatory Visit: Payer: Self-pay

## 2017-11-10 DIAGNOSIS — Z79899 Other long term (current) drug therapy: Secondary | ICD-10-CM | POA: Insufficient documentation

## 2017-11-10 DIAGNOSIS — H10023 Other mucopurulent conjunctivitis, bilateral: Secondary | ICD-10-CM | POA: Insufficient documentation

## 2017-11-10 MED ORDER — ERYTHROMYCIN 5 MG/GM OP OINT
TOPICAL_OINTMENT | Freq: Four times a day (QID) | OPHTHALMIC | Status: DC
  Administered 2017-11-10: 1 via OPHTHALMIC
  Filled 2017-11-10: qty 3.5

## 2017-11-10 NOTE — ED Triage Notes (Signed)
Returns for bilateral eye redness, watering, drainage, matting and itching. Rates pain/irritation 5/10. Onset Wednesday. Seen here previously and diagnosed with allergic conjunctivitis. Reports no relief with OTC zyrtec, flonase and carboxymethylcellulose gtts. Has also tried antihistamine gtts and redness reliever gtts. 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 Pt noted to have gtts w/o caps kept in her work apron. Denies glasses or contacts. Denies fever or vision changes.   Alert, NAD, calm, interactive, resps e/u, speaking in clear complete sentences, no dyspnea noted, skin W&D, VSS, (also denies: sob, nausea, dizziness). Steady gait.

## 2017-11-10 NOTE — ED Provider Notes (Signed)
[facility] Provider Note: Lowella DellJ. Lane Ammy Lienhard, MD, FACEP  CSN: [id number] MRN: [id number] ARRIVAL: 11/10/17 at 0202 ROOM: [facility]06/[facility]06   CHIEF COMPLAINT  Eye Problem   HISTORY OF PRESENT ILLNESS  11/10/17 2:29 AM Emily Bennett is a 26 y.o. female who complains of eye irritation and redness for the past 4 days. There has been associated sneezing and rhinorrhea.  She was seen in the ED 2 days ago and diagnosed with allergic conjunctivitis.  She was prescribed artificial tears, Flonase and Zyrtec.  She has also been using an over-the-counter ophthalmic antihistamine and a vasoconstrictor.  Despite these she continues to be symptomatic.  Her eyes to continue to be red.  She awakens in the morning with crusting of her eyes holding her lids together.  She describes her eyes is painful, not burning, and rates it as a 5 out of 10.  She denies photophobia.  She denies cough or difficulty breathing.   History reviewed. No pertinent past medical history.  History reviewed. No pertinent surgical history.  History reviewed. No pertinent family history.  Social History   Tobacco Use  . Smoking status: Never Smoker  . Smokeless tobacco: Never Used  Substance Use Topics  . Alcohol use: No  . Drug use: No    Prior to Admission medications   Medication Sig Start Date End Date Taking? Authorizing Provider  carboxymethylcellulose 1 % ophthalmic solution Apply 1 drop to eye 3 (three) times daily. 11/08/17   Caccavale, Sophia, PA-C  cetirizine (ZYRTEC) 10 MG tablet Take 1 tablet (10 mg total) by mouth daily. 11/08/17   Caccavale, Sophia, PA-C  fluticasone (FLONASE) 50 MCG/ACT nasal spray Place 1 spray into both nostrils daily. 11/08/17   Caccavale, Sophia, PA-C  ibuprofen (ADVIL,MOTRIN) 800 MG tablet Take 800 mg by mouth every 8 (eight) hours as needed for moderate pain.    [provider]  ibuprofen (ADVIL,MOTRIN) 800 MG tablet Take 1 tablet (800 mg total) by mouth 3 (three) times  daily. 05/17/14   Junious SilkMerrell, Hannah, PA-C  penicillin v potassium (VEETID) 500 MG tablet Take 500 mg by mouth 4 (four) times daily. Started course of medication two weeks ago from today 05-17-14 05/08/14   Earley FavorSchulz, Gail, NP  traMADol (ULTRAM) 50 MG tablet Take 50 mg by mouth once. 05/08/14   Earley FavorSchulz, Gail, NP    Allergies Patient has no known allergies.   REVIEW OF SYSTEMS  Negative except as noted here or in the History of Present Illness.   PHYSICAL EXAMINATION  Initial Vital Signs Blood pressure 122/76, pulse 73, temperature 98.6 F (37 C), temperature source Oral, resp. rate 16, height 5\' 3"  (1.6 m), weight 61.7 kg (136 lb), last menstrual period 10/17/2017, SpO2 100 %.  Examination General: Well-developed, well-nourished female in no acute distress; appearance consistent with age of record HENT: normocephalic; atraumatic Eyes: pupils equal, round and reactive to light; extraocular muscles intact; bilateral conjunctival erythema; slight mucoid discharge Neck: supple Heart: regular rate and rhythm Lungs: clear to auscultation bilaterally Abdomen: soft; nondistended Extremities: No deformity; full range of motion Neurologic: Awake, alert and oriented; motor function intact in all extremities and symmetric; no facial droop Skin: Warm and dry Psychiatric: Normal mood and affect   RESULTS  Summary of this visit's results, reviewed by myself:   EKG Interpretation  Date/Time:    Ventricular Rate:    PR Interval:    QRS Duration:   QT Interval:    QTC Calculation:   R Axis:  Text Interpretation:        Laboratory Studies: No results found for this or any previous visit (from the past 24 hour(s)). Imaging Studies: No results found.  ED COURSE  Nursing notes and initial vitals signs, including pulse oximetry, reviewed.  Vitals:   11/10/17 0226 11/10/17 0227  BP: 122/76   Pulse: 73   Resp: 16   Temp: 98.6 F (37 C)   TempSrc: Oral   SpO2: 100%   Weight:  61.7 kg  (136 lb)  Height:  5\' 3"  (1.6 m)   Will switch to erythromycin ophthalmic ointment as she has failed treatment for allergic conjunctivitis.  PROCEDURES    ED DIAGNOSES     ICD-10-CM   1. Conjunctivitis, catarrhal, bilateral H10.023        Emersyn Kotarski, Jonny RuizJohn, MD 11/10/17 [phone]

## 2017-11-10 NOTE — ED Notes (Signed)
EDP into room 

## 2017-11-11 ENCOUNTER — Encounter (HOSPITAL_BASED_OUTPATIENT_CLINIC_OR_DEPARTMENT_OTHER): Payer: Self-pay | Admitting: *Deleted

## 2017-11-11 ENCOUNTER — Other Ambulatory Visit: Payer: Self-pay

## 2017-11-11 ENCOUNTER — Emergency Department (HOSPITAL_BASED_OUTPATIENT_CLINIC_OR_DEPARTMENT_OTHER)
Admission: EM | Admit: 2017-11-11 | Discharge: 2017-11-11 | Disposition: A | Discharge status: Home / Self Care | Payer: Self-pay | Attending: Emergency Medicine | Admitting: Emergency Medicine

## 2017-11-11 DIAGNOSIS — H1033 Unspecified acute conjunctivitis, bilateral: Secondary | ICD-10-CM | POA: Insufficient documentation

## 2017-11-11 MED ORDER — POLYMYXIN B-TRIMETHOPRIM 10000-0.1 UNIT/ML-% OP SOLN: 1.0000 [drp] | OPHTHALMIC | 0 refills | Status: AC

## 2017-11-11 MED FILL — POLYMYXIN B/TMP EYE DROPS: 10000-0.1 | 10 days supply | Qty: 10 | Fill #0

## 2017-11-11 NOTE — Discharge Instructions (Signed)
Please stop using all of your other eye ointments and drops and switch to the new drops.  Please follow-up with an ophthalmologist for reevaluation or worsen, please return to the nearest emergency department.

## 2017-11-11 NOTE — ED Triage Notes (Signed)
Redness, drainage, matting and itching of both eyes. She was seen yesterday and given ointment but it is worse today.

## 2017-11-11 NOTE — ED Provider Notes (Signed)
[facility] EMERGENCY DEPARTMENT Provider Note   CSN: [id number] Arrival date & time: 11/11/17  1046     History   Chief Complaint Chief Complaint  Patient presents with  . Conjunctivitis    HPI Emily Bennett is a 26 y.o. female.  The history is provided by the patient and medical records. No language interpreter was used.  Conjunctivitis  This is a new problem. The current episode started more than 2 days ago. The problem occurs constantly. The problem has been gradually worsening. Pertinent negatives include no chest pain, no abdominal pain, no headaches and no shortness of breath. Nothing aggravates the symptoms. Nothing relieves the symptoms. Treatments tried: drops, ointments. The treatment provided no relief.    History reviewed. No pertinent past medical history.  There are no active problems to display for this patient.   History reviewed. No pertinent surgical history.  OB History    No data available       Home Medications    Prior to Admission medications   Medication Sig Start Date End Date Taking? Authorizing Provider  fluticasone (FLONASE) 50 MCG/ACT nasal spray Place 1 spray into both nostrils daily. 11/08/17  Yes Caccavale, Sophia, PA-C  cetirizine (ZYRTEC) 10 MG tablet Take 1 tablet (10 mg total) by mouth daily. 11/08/17   Caccavale, Sophia, PA-C    Family History No family history on file.  Social History Social History   Tobacco Use  . Smoking status: Never Smoker  . Smokeless tobacco: Never Used  Substance Use Topics  . Alcohol use: No  . Drug use: No     Allergies   Patient has no known allergies.   Review of Systems Review of Systems  Constitutional: Negative for chills, diaphoresis, fatigue and fever.  HENT: Negative for congestion.   Eyes: Positive for pain, discharge and redness. Negative for photophobia, itching and visual disturbance.  Respiratory: Negative for cough, chest tightness, shortness of breath,  wheezing and stridor.   Cardiovascular: Negative for chest pain.  Gastrointestinal: Negative for abdominal pain, constipation, diarrhea, nausea and vomiting.  Genitourinary: Negative for dysuria, flank pain and frequency.  Musculoskeletal: Negative for back pain, neck pain and neck stiffness.  Skin: Negative for rash and wound.  Neurological: Negative for light-headedness, numbness and headaches.  Psychiatric/Behavioral: Negative for agitation and confusion.  All other systems reviewed and are negative.    Physical Exam Updated Vital Signs BP 106/69   Pulse 85   Temp 98.6 F (37 C) (Oral)   Resp 16   Ht 5\' 3"  (1.6 m)   Wt 61.7 kg (136 lb)   LMP 10/17/2017   SpO2 99%   BMI 24.09 kg/m   Physical Exam  Constitutional: She is oriented to person, place, and time. She appears well-developed and well-nourished. No distress.  HENT:  Head: Normocephalic and atraumatic.  Mouth/Throat: Oropharynx is clear and moist. No oropharyngeal exudate.  Eyes: EOM are normal. Pupils are equal, round, and reactive to light. Right eye exhibits discharge. Left eye exhibits discharge. Right conjunctiva is injected. Left conjunctiva is injected. No scleral icterus. Right eye exhibits normal extraocular motion. Left eye exhibits normal extraocular motion. Pupils are equal.    Injected conjunctiva bilaterally.  No consensual photophobia.  Normal pupillary exam.  Normal extraocular movements with no pain with movement.  Discharge present in both eyes.  No evidence of orbital cellulitis or periorbital cellulitis.  Nasal and oral exam unremarkable.  Ear exam unremarkable.  No blurry vision or diplopia.  Neck: Normal range of motion.  Cardiovascular: Normal rate and intact distal pulses.  No murmur heard. Pulmonary/Chest: Effort normal and breath sounds normal. No respiratory distress. She has no wheezes. She has no rales. She exhibits no tenderness.  Abdominal: There is no tenderness.  Musculoskeletal: She  exhibits no tenderness.  Lymphadenopathy:    She has no cervical adenopathy.  Neurological: She is alert and oriented to person, place, and time. No sensory deficit. She exhibits normal muscle tone.  Skin: Capillary refill takes less than 2 seconds. No rash noted. She is not diaphoretic. No erythema.  Psychiatric: She has a normal mood and affect.  Nursing note and vitals reviewed.    ED Treatments / Results  Labs (all labs ordered are listed, but only abnormal results are displayed) Labs Reviewed - No data to display  EKG  EKG Interpretation None       Radiology No results found.  Procedures Procedures (including critical care time)  Medications Ordered in ED Medications - No data to display   Initial Impression / Assessment and Plan / ED Course  I have reviewed the triage vital signs and the nursing notes.  Pertinent labs & imaging results that were available during my care of the patient were reviewed by me and considered in my medical decision making (see chart for details).     Emily Bennett is a 26 y.o. female with a past medical history significant for conjunctivitis over the last few days who presents with worsening eye redness.  Patient says that she was initially seen and informed she likely had an allergic conjunctivitis.  Patient was given drops.  Patient then returned yesterday with continued redness and discomfort.  No vision changes.  Patient was started on erythromycin ointment.  Patient says that she used it today and continues to have symptoms including worsening redness and pain.  Patient presents for reevaluation.  Patient reports no pain with eye movements and no vision changes.  No diplopia or blurry vision after the patient blinks.  Patient has no neck pain, sore throat, or rhinorrhea.  No other symptoms.  No cough, congestion, fevers, or chills.  On exam, lungs are clear.  Chest is nontender, abdomen nontender.  Neck is nontender.  Neck is supple.   Nose exam unremarkable.  Oropharyngeal exam unremarkable.  Patient has bilaterally injected conjunctiva.  There is crusting and drainage in both eyes.  No evidence of periorbital erythema or periorbital cellulitis.  No tenderness around the eyes.  Normal extraocular movements and normal pupil exam.  Consensual photophobia.  No pain with eye movements.  Based on patient's symptoms and exam, suspect bacterial conjunctivitis.  There may also be a component of irritation or allergy to previous antibiotic ointment.  Patient will be changed to different treatment.  Polymyxin will be ordered.  Patient advised to follow-up with PCP as well as an ophthalmologist if her symptoms not improved in several days.  Given patient's continued normal vision, do not feel patient needs emergent ophthalmological evaluation.  Doubt iritis given normal vision and minimal pain.  Doubt cellulitis given lack of skin involvement and no tenderness around eyes.  Doubt systemic infection.  Patient understood return precautions and management instructions.  Patient will follow-up and understood plan of care.  Patient discharged in good condition.   Final Clinical Impressions(s) / ED Diagnoses   Final diagnoses:  Acute bacterial conjunctivitis of both eyes    ED Discharge Orders        Ordered  trimethoprim-polymyxin b (POLYTRIM) ophthalmic solution  Every 4 hours     11/11/17 1408     Clinical Impression: 1. Acute bacterial conjunctivitis of both eyes     Disposition: Discharge  Condition: Good  I have discussed the results, Dx and Tx plan with the pt(& family if present). He/she/they expressed understanding and agree(s) with the plan. Discharge instructions discussed at great length. Strict return precautions discussed and pt &/or family have verbalized understanding of the instructions. No further questions at time of discharge.    This SmartLink is deprecated. Use AVSMEDLIST instead to display the medication list  for a patient.  Follow Up: Dimitri PedWeaver, Christopher D, MD [address] [place] [id number] [phone]  Schedule an appointment as soon as possible for a visit  with ophthalmology  [facility] [address] [id number] [address]c [address] [place] [id number] [phone]  If symptoms worsen     Tegeler, Canary Brimhristopher J, MD 11/11/17 [phone]

## 2017-11-12 ENCOUNTER — Other Ambulatory Visit: Payer: Self-pay

## 2017-11-12 ENCOUNTER — Emergency Department (HOSPITAL_BASED_OUTPATIENT_CLINIC_OR_DEPARTMENT_OTHER)
Admission: EM | Admit: 2017-11-12 | Discharge: 2017-11-12 | Disposition: A | Discharge status: Home / Self Care | Payer: Self-pay | Attending: Emergency Medicine | Admitting: Emergency Medicine

## 2017-11-12 ENCOUNTER — Encounter (HOSPITAL_BASED_OUTPATIENT_CLINIC_OR_DEPARTMENT_OTHER): Payer: Self-pay | Admitting: *Deleted

## 2017-11-12 DIAGNOSIS — H1033 Unspecified acute conjunctivitis, bilateral: Secondary | ICD-10-CM | POA: Insufficient documentation

## 2017-11-12 MED ORDER — LEVOFLOXACIN 0.5 % OP SOLN: 1.0000 [drp] | OPHTHALMIC | 0 refills | Status: AC

## 2017-11-12 NOTE — ED Triage Notes (Signed)
C/o eye pain and redness,  Has been seen several times for same,  Now states has red drainage from eyes

## 2017-11-12 NOTE — Discharge Instructions (Signed)
You were seen in the ED today with continued eye redness. I would like for you to see an eye doctor. Use the eye drops provided if symptoms do not improve in 1-2 days.

## 2017-11-12 NOTE — ED Provider Notes (Signed)
Emergency Department Provider Note   I have reviewed the triage vital signs and the nursing notes.   HISTORY  Chief Complaint Eye Pain   HPI Emily Bennett is a 26 y.o. female to the emergency department for evaluation of eye redness and discharge.  This is her fourth visit to the emergency department in the last several days.  No trauma to the eye.  No pain with extraocular movement.  No vision changes.  No headaches.  No swelling around the eye.  No sick contacts.  No chemical exposures to the eye that she knows of.  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  No fevers or chills.  She has been using at first her erythromycin ointment and started Polytrim drops yesterday.    History reviewed. No pertinent past medical history.  There are no active problems to display for this patient.   History reviewed. No pertinent surgical history.  Current Outpatient Rx  . Order #: [id number] Class: Print  . Order #: [id number] Class: Print  . Order #: [id number] Class: Print  . Order #: [id number] Class: Print    Allergies Patient has no known allergies.  No family history on file.  Social History Social History   Tobacco Use  . Smoking status: Never Smoker  . Smokeless tobacco: Never Used  Substance Use Topics  . Alcohol use: No  . Drug use: No    Review of Systems  Constitutional: No fever/chills Eyes: Positive eye redness and discharge.  ENT: No sore throat. Cardiovascular: Denies chest pain. Respiratory: Denies shortness of breath. Gastrointestinal: No abdominal pain.  No nausea, no vomiting.  No diarrhea.  No constipation. Genitourinary: Negative for dysuria. Musculoskeletal: Negative for back pain. Skin: Negative for rash. Neurological: Negative for headaches, focal weakness or numbness.  10-point ROS otherwise negative.  ____________________________________________   PHYSICAL EXAM:  VITAL SIGNS: ED Triage Vitals  Enc Vitals Group   BP 11/12/17 0644 110/61     Pulse Rate 11/12/17 0644 67     Resp --      Temp 11/12/17 0644 98 F (36.7 C)     Temp Source 11/12/17 0644 Oral     SpO2 11/12/17 0644 99 %     Weight 11/12/17 0646 136 lb (61.7 kg)     Height 11/12/17 0646 5\' 3"  (1.6 m)     Pain Score 11/12/17 0644 7    Constitutional: Alert and oriented. Well appearing and in no acute distress. Eyes: Conjunctivae injected bilaterally with clear discharge. PERRL. EOMI. Normal fundoscopic exam. No periorbital edema.  Head: Atraumatic. Nose: No congestion/rhinnorhea. Mouth/Throat: Mucous membranes are moist.  Neck: No stridor.  Cardiovascular: Normal rate, regular rhythm. Good peripheral circulation. Grossly normal heart sounds.   Respiratory: Normal respiratory effort.  No retractions. Lungs CTAB. Gastrointestinal: Soft and nontender. No distention.  Musculoskeletal: No lower extremity tenderness nor edema. No gross deformities of extremities. Neurologic:  Normal speech and language. No gross focal neurologic deficits are appreciated.  Skin:  Skin is warm, dry and intact. No rash noted.  ____________________________________________  RADIOLOGY  None ____________________________________________   PROCEDURES  Procedure(s) performed:   Procedures  None ____________________________________________   INITIAL IMPRESSION / ASSESSMENT AND PLAN / ED COURSE  Pertinent labs & imaging results that were available during my care of the patient were reviewed by me and considered in my medical decision making (see chart for details).  Patient presents the emergency department for evaluation of this.  She is not improving with erythromycin  or polymyxin drops.  I suspect that this may be a viral process.  No evidence of orbital cellulitis on exam.  No concern for gonococcal conjunctivitis.  Plan for the patient to continue her Polytrim drops and schedule a follow-up appointment with the ophthalmologist.  I will provide a  watch and wait prescription for Levaquin drops to start in 2 days if symptoms do not improve.   At this time, I do not feel there is any life-threatening condition present. I have reviewed and discussed all results (EKG, imaging, lab, urine as appropriate), exam findings with patient. I have reviewed nursing notes and appropriate previous records.  I feel the patient is safe to be discharged home without further emergent workup. Discussed usual and customary return precautions. Patient and family (if present) verbalize understanding and are comfortable with this plan.  Patient will follow-up with their primary care provider. If they do not have a primary care provider, information for follow-up has been provided to them. All questions have been answered.  ____________________________________________  FINAL CLINICAL IMPRESSION(S) / ED DIAGNOSES  Final diagnoses:  Acute conjunctivitis of both eyes, unspecified acute conjunctivitis type     Note:  This document was prepared using Dragon voice recognition software and may include unintentional dictation errors.  Alona BeneJoshua Kauan Kloosterman, MD Emergency Medicine   Melford Tullier, Arlyss RepressJoshua G, MD 11/12/17 [phone]

## 2018-01-24 ENCOUNTER — Other Ambulatory Visit: Payer: Self-pay

## 2018-01-24 ENCOUNTER — Emergency Department (HOSPITAL_BASED_OUTPATIENT_CLINIC_OR_DEPARTMENT_OTHER)
Admission: EM | Admit: 2018-01-24 | Discharge: 2018-01-24 | Disposition: A | Discharge status: Home / Self Care | Payer: BLUE CROSS/BLUE SHIELD | Attending: Emergency Medicine | Admitting: Emergency Medicine

## 2018-01-24 ENCOUNTER — Emergency Department (HOSPITAL_BASED_OUTPATIENT_CLINIC_OR_DEPARTMENT_OTHER): Payer: BLUE CROSS/BLUE SHIELD

## 2018-01-24 ENCOUNTER — Encounter (HOSPITAL_BASED_OUTPATIENT_CLINIC_OR_DEPARTMENT_OTHER): Payer: Self-pay | Admitting: Emergency Medicine

## 2018-01-24 DIAGNOSIS — O9989 Other specified diseases and conditions complicating pregnancy, childbirth and the puerperium: Secondary | ICD-10-CM | POA: Insufficient documentation

## 2018-01-24 DIAGNOSIS — Z3A01 Less than 8 weeks gestation of pregnancy: Secondary | ICD-10-CM | POA: Insufficient documentation

## 2018-01-24 DIAGNOSIS — O3461 Maternal care for abnormality of vagina, first trimester: Secondary | ICD-10-CM | POA: Diagnosis not present

## 2018-01-24 DIAGNOSIS — R1030 Lower abdominal pain, unspecified: Secondary | ICD-10-CM | POA: Diagnosis present

## 2018-01-24 DIAGNOSIS — B9689 Other specified bacterial agents as the cause of diseases classified elsewhere: Secondary | ICD-10-CM | POA: Insufficient documentation

## 2018-01-24 DIAGNOSIS — Z79899 Other long term (current) drug therapy: Secondary | ICD-10-CM | POA: Diagnosis not present

## 2018-01-24 DIAGNOSIS — Z3491 Encounter for supervision of normal pregnancy, unspecified, first trimester: Secondary | ICD-10-CM | POA: Insufficient documentation

## 2018-01-24 DIAGNOSIS — O23591 Infection of other part of genital tract in pregnancy, first trimester: Secondary | ICD-10-CM | POA: Diagnosis not present

## 2018-01-24 DIAGNOSIS — N760 Acute vaginitis: Secondary | ICD-10-CM | POA: Diagnosis not present

## 2018-01-24 LAB — URINALYSIS, ROUTINE W REFLEX MICROSCOPIC
Bilirubin Urine: NEGATIVE
GLUCOSE, UA: NEGATIVE mg/dL
Hgb urine dipstick: NEGATIVE
Ketones, ur: 15 mg/dL — AB
LEUKOCYTES UA: NEGATIVE
Nitrite: POSITIVE — AB
Protein, ur: NEGATIVE mg/dL
SPECIFIC GRAVITY, URINE: 1.02 (ref 1.005–1.030)
pH: 6 (ref 5.0–8.0)

## 2018-01-24 LAB — HCG, QUANTITATIVE, PREGNANCY: hCG, Beta Chain, Quant, S: 51483 m[IU]/mL — ABNORMAL HIGH (ref <5)

## 2018-01-24 LAB — COMPREHENSIVE METABOLIC PANEL
ALBUMIN: 3.8 g/dL (ref 3.5–5.0)
ALK PHOS: 45 U/L (ref 38–126)
ALT: 15 U/L (ref 14–54)
ANION GAP: 8 (ref 5–15)
AST: 23 U/L (ref 15–41)
BUN: 14 mg/dL (ref 6–20)
CALCIUM: 8.6 mg/dL — AB (ref 8.9–10.3)
CO2: 26 mmol/L (ref 22–32)
Chloride: 102 mmol/L (ref 101–111)
Creatinine, Ser: 0.87 mg/dL (ref 0.44–1.00)
GFR calc Af Amer: >60 mL/min (ref >60)
GFR calc non Af Amer: >60 mL/min (ref >60)
GLUCOSE: 101 mg/dL — AB (ref 65–99)
Potassium: 3.8 mmol/L (ref 3.5–5.1)
SODIUM: 136 mmol/L (ref 135–145)
Total Bilirubin: 0.2 mg/dL — ABNORMAL LOW (ref 0.3–1.2)
Total Protein: 7.7 g/dL (ref 6.5–8.1)

## 2018-01-24 LAB — CBC WITH DIFFERENTIAL/PLATELET
BASOS PCT: 0 %
Basophils Absolute: 0 10*3/uL (ref 0.0–0.1)
EOS ABS: 0.1 10*3/uL (ref 0.0–0.7)
EOS PCT: 1 %
HCT: 39.6 % (ref 36.0–46.0)
Hemoglobin: 13.4 g/dL (ref 12.0–15.0)
Lymphocytes Relative: 27 %
Lymphs Abs: 1.4 10*3/uL (ref 0.7–4.0)
MCH: 27.4 pg (ref 26.0–34.0)
MCHC: 33.8 g/dL (ref 30.0–36.0)
MCV: 81 fL (ref 78.0–100.0)
Monocytes Absolute: 0.6 10*3/uL (ref 0.1–1.0)
Monocytes Relative: 12 %
NEUTROS PCT: 60 %
Neutro Abs: 3.3 10*3/uL (ref 1.7–7.7)
PLATELETS: 221 10*3/uL (ref 150–400)
RBC: 4.89 MIL/uL (ref 3.87–5.11)
RDW: 13.7 % (ref 11.5–15.5)
WBC: 5.4 10*3/uL (ref 4.0–10.5)

## 2018-01-24 LAB — URINALYSIS, MICROSCOPIC (REFLEX): RBC / HPF: NONE SEEN RBC/hpf (ref 0–5)

## 2018-01-24 LAB — WET PREP, GENITAL
SPERM: NONE SEEN
Trich, Wet Prep: NONE SEEN
YEAST WET PREP: NONE SEEN

## 2018-01-24 LAB — PREGNANCY, URINE: Preg Test, Ur: POSITIVE — AB

## 2018-01-24 MED ORDER — SODIUM CHLORIDE 0.9 % IV SOLN
1.0000 g | Freq: Once | INTRAVENOUS | Status: AC
  Administered 2018-01-24: 1 g via INTRAVENOUS
  Filled 2018-01-24: qty 10

## 2018-01-24 MED ORDER — METRONIDAZOLE 500 MG PO TABS: 500.0000 mg | ORAL_TABLET | Freq: Two times a day (BID) | ORAL | 0 refills | Status: DC

## 2018-01-24 MED ORDER — SODIUM CHLORIDE 0.9 % IV BOLUS (SEPSIS)
1000.0000 mL | Freq: Once | INTRAVENOUS | Status: AC
  Administered 2018-01-24: 1000 mL via INTRAVENOUS

## 2018-01-24 MED ORDER — CEPHALEXIN 500 MG PO CAPS: 500.0000 mg | ORAL_CAPSULE | Freq: Three times a day (TID) | ORAL | 0 refills | Status: DC

## 2018-01-24 NOTE — ED Provider Notes (Signed)
[facility] EMERGENCY DEPARTMENT Provider Note   CSN: [id number] Arrival date & time: 01/24/18  1840     History   Chief Complaint Chief Complaint  Patient presents with  . Abdominal Pain    HPI AUBRIEGH MINCH is a 27 y.o. female.  The history is provided by the patient. No language interpreter was used.  Abdominal Pain     MIRYAM MCELHINNEY is a 27 y.o. female who presents to the Emergency Department complaining of abdominal pain, positive pregnancy test.  She developed lower abdominal cramping a few days ago, waxing and waning and mild in nature.  She had a positive home pregnancy test four days ago.  Denies fevers, nausea, vomiting, dysuria, vaginal discharge, bleeding.  LMP was end of January.  No prior pregnancies.   History reviewed. No pertinent past medical history.  There are no active problems to display for this patient.   History reviewed. No pertinent surgical history.  OB History    Gravida Para Term Preterm AB Living   1             SAB TAB Ectopic Multiple Live Births                   Home Medications    Prior to Admission medications   Medication Sig Start Date End Date Taking? Authorizing Provider  cephALEXin (KEFLEX) 500 MG capsule Take 1 capsule (500 mg total) by mouth 3 (three) times daily. 01/24/18   Tilden Fossa, MD  cetirizine (ZYRTEC) 10 MG tablet Take 1 tablet (10 mg total) by mouth daily. 11/08/17   Caccavale, Sophia, PA-C  fluticasone (FLONASE) 50 MCG/ACT nasal spray Place 1 spray into both nostrils daily. 11/08/17   Caccavale, Sophia, PA-C  metroNIDAZOLE (FLAGYL) 500 MG tablet Take 1 tablet (500 mg total) by mouth 2 (two) times daily. 01/24/18   Tilden Fossa, MD    Family History History reviewed. No pertinent family history.  Social History Social History   Tobacco Use  . Smoking status: Never Smoker  . Smokeless tobacco: Never Used  Substance Use Topics  . Alcohol use: No  . Drug use: No     Allergies     Patient has no known allergies.   Review of Systems Review of Systems  Gastrointestinal: Positive for abdominal pain.  All other systems reviewed and are negative.    Physical Exam Updated Vital Signs BP 126/80 (BP Location: Left Arm)   Pulse 82   Temp 98.1 F (36.7 C) (Oral)   Resp 16   Ht 5\' 3"  (1.6 m)   Wt 64.9 kg (143 lb)   LMP 12/26/2017 (Approximate)   SpO2 100%   BMI 25.33 kg/m   Physical Exam  Constitutional: She is oriented to person, place, and time. She appears well-developed and well-nourished.  HENT:  Head: Normocephalic and atraumatic.  Cardiovascular: Normal rate and regular rhythm.  No murmur heard. Pulmonary/Chest: Effort normal and breath sounds normal. No respiratory distress.  Abdominal: Soft. There is no rebound and no guarding.  Mild suprapubic tenderness  Genitourinary:  Genitourinary Comments: Moderate white vaginal discharge.  No CMT or adnexal tenderness.  Musculoskeletal: She exhibits no edema or tenderness.  Neurological: She is alert and oriented to person, place, and time.  Skin: Skin is warm and dry.  Psychiatric: She has a normal mood and affect. Her behavior is normal.  Nursing note and vitals reviewed.    ED Treatments / Results  Labs (all labs ordered are  listed, but only abnormal results are displayed) Labs Reviewed  WET PREP, GENITAL - Abnormal; Notable for the following components:      Result Value   Clue Cells Wet Prep HPF POC PRESENT (*)    WBC, Wet Prep HPF POC FEW (*)    All other components within normal limits  URINALYSIS, ROUTINE W REFLEX MICROSCOPIC - Abnormal; Notable for the following components:   APPearance CLOUDY (*)    Ketones, ur 15 (*)    Nitrite POSITIVE (*)    All other components within normal limits  PREGNANCY, URINE - Abnormal; Notable for the following components:   Preg Test, Ur POSITIVE (*)    All other components within normal limits  URINALYSIS, MICROSCOPIC (REFLEX) - Abnormal; Notable for  the following components:   Bacteria, UA MANY (*)    Squamous Epithelial / LPF 6-30 (*)    All other components within normal limits  COMPREHENSIVE METABOLIC PANEL - Abnormal; Notable for the following components:   Glucose, Bld 101 (*)    Calcium 8.6 (*)    Total Bilirubin 0.2 (*)    All other components within normal limits  HCG, QUANTITATIVE, PREGNANCY - Abnormal; Notable for the following components:   hCG, Beta Chain, Quant, S 51,483 (*)    All other components within normal limits  URINE CULTURE  CBC WITH DIFFERENTIAL/PLATELET  RPR  HIV ANTIBODY (ROUTINE TESTING)  GC/CHLAMYDIA PROBE AMP ([place]) NOT AT [facility]    EKG  EKG Interpretation None       Radiology US Ob Comp < 14 Wks  Result Date: 01/24/2018 CLINICAL DATA:  Pregnant patient in first-trimester pregnancy with lower abdominal pain and cramping. EXAM: OBSTETRIC <14 WK [place] AND TRANSVAGINAL OB US TECHNIQUE: Both transabdominal and transvaginal ultrasound examinations were performed for complete evaluation of the gestation as well as the maternal uterus, adnexal regions, and pelvic cul-de-sac. Transvaginal technique was performed to assess early pregnancy. COMPARISON:  None. FINDINGS: Intrauterine gestational sac: Single. Yolk sac:  Visualized. Embryo:  Visualized. Cardiac Activity: Visualized. Heart Rate: 104 bpm CRL:  2 mm   5 w   5 d                  [place] EDC: 09/21/2018 Subchorionic hemorrhage: Small lateral to the gestational sac, measuring approximately 1.3 cm. Maternal uterus/adnexae: Rounded echogenic bulge protruding into the gestational sac, larger measuring 9 x 7 x 6 mm, smaller 5 x 5 x 7 mm, likely chorionic bump. Probable corpus luteal cyst in the right ovary. Adjacent to the right ovary is a 3.3 x 3.0 x 2.7 cm cyst. The left ovary is tentatively identified and normal. Trace pelvic free fluid. IMPRESSION: 1. Early single live intrauterine pregnancy estimated gestational age [redacted] weeks 5 days based on crown-rump length  with estimated date of delivery 09/21/2018. 2. Small subchorionic hemorrhage. 3. Rounded echogenic structures protruding into the gestational sac are likely chorionic bumps. This may be associated with guarded pregnancy prognosis, recommend close clinical and sonographic follow-up. 4. Exophytic or paraovarian right ovarian cyst measuring 3.3 cm. Attention to this at follow-up recommended. Electronically Signed   By: Rubye Oaks M.D.   On: 01/24/2018 22:58   US Ob Transvaginal  Result Date: 01/24/2018 CLINICAL DATA:  Pregnant patient in first-trimester pregnancy with lower abdominal pain and cramping. EXAM: OBSTETRIC <14 WK [place] AND TRANSVAGINAL OB US TECHNIQUE: Both transabdominal and transvaginal ultrasound examinations were performed for complete evaluation of the gestation as well as the maternal uterus, adnexal regions,  and pelvic cul-de-sac. Transvaginal technique was performed to assess early pregnancy. COMPARISON:  None. FINDINGS: Intrauterine gestational sac: Single. Yolk sac:  Visualized. Embryo:  Visualized. Cardiac Activity: Visualized. Heart Rate: 104 bpm CRL:  2 mm   5 w   5 d                  US EDC: 09/21/2018 Subchorionic hemorrhage: Small lateral to the gestational sac, measuring approximately 1.3 cm. Maternal uterus/adnexae: Rounded echogenic bulge protruding into the gestational sac, larger measuring 9 x 7 x 6 mm, smaller 5 x 5 x 7 mm, likely chorionic bump. Probable corpus luteal cyst in the right ovary. Adjacent to the right ovary is a 3.3 x 3.0 x 2.7 cm cyst. The left ovary is tentatively identified and normal. Trace pelvic free fluid. IMPRESSION: 1. Early single live intrauterine pregnancy estimated gestational age [redacted] weeks 5 days based on crown-rump length with estimated date of delivery 09/21/2018. 2. Small subchorionic hemorrhage. 3. Rounded echogenic structures protruding into the gestational sac are likely chorionic bumps. This may be associated with guarded pregnancy prognosis,  recommend close clinical and sonographic follow-up. 4. Exophytic or paraovarian right ovarian cyst measuring 3.3 cm. Attention to this at follow-up recommended. Electronically Signed   By: Rubye OaksMelanie  Ehinger M.D.   On: 01/24/2018 22:58    Procedures Procedures (including critical care time)  Medications Ordered in ED Medications  sodium chloride 0.9 % bolus 1,000 mL (0 mLs Intravenous Stopped 01/24/18 2338)  cefTRIAXone (ROCEPHIN) 1 g in sodium chloride 0.9 % 100 mL IVPB (0 g Intravenous Stopped 01/24/18 2309)     Initial Impression / Assessment and Plan / ED Course  I have reviewed the triage vital signs and the nursing notes.  Pertinent labs & imaging results that were available during my care of the patient were reviewed by me and considered in my medical decision making (see chart for details).     Patient here for evaluation of lower abdominal pain, positive home pregnancy test.  Pregnancy test in the emergency department is positive.  Pelvic exam with some discharge, no findings concerning for PID.  UA is consistent with UTI, will treat.  Wet prep does demonstrate BV, will treat in setting of pregnancy and significant discharge.  Pelvic ultrasound demonstrates IUP that is 5 weeks and 5 days with chorionic bumps.  Discussed ultrasound findings with OB/GYN on-call, recommends routine outpatient follow-up with bleeding precautions.  Discussed with patient findings of studies with UTI, BV, early pregnancy.  Discussed threatened miscarriage precautions.  Discussed outpatient follow-up as well as return precautions.  Final Clinical Impressions(s) / ED Diagnoses   Final diagnoses:  First trimester pregnancy  Lower abdominal pain  BV (bacterial vaginosis)    ED Discharge Orders        Ordered    cephALEXin (KEFLEX) 500 MG capsule  3 times daily     01/24/18 2328    metroNIDAZOLE (FLAGYL) 500 MG tablet  2 times daily     01/24/18 2328       Tilden Fossaees, Kazzandra Desaulniers, MD 01/24/18 2354

## 2018-01-24 NOTE — ED Notes (Signed)
Remains in US.

## 2018-01-24 NOTE — ED Triage Notes (Addendum)
Patient reports she took 2 pregnancy tests this week and they were positive.  Reports she is now having lower abdominal pain.  Requesting to know how far along she is in her pregnancy.  Denies vaginal bleeding.  Reports last menstrual cycle was "end of January".

## 2018-01-24 NOTE — Discharge Instructions (Signed)
Your ultrasound today showed a pregnancy that is 5 weeks and 5 days.  Please follow-up with your OB[facility]GYN or [facility].  Start taking a prenatal vitamin, available over-the-counter.

## 2018-01-24 NOTE — ED Notes (Signed)
Patient transported to Ultrasound 

## 2018-01-26 LAB — RPR: RPR Ser Ql: NONREACTIVE

## 2018-01-26 LAB — HIV ANTIBODY (ROUTINE TESTING W REFLEX): HIV Screen 4th Generation wRfx: NONREACTIVE

## 2018-01-27 LAB — URINE CULTURE: Culture: >=100000 — AB

## 2018-01-27 LAB — GC/CHLAMYDIA PROBE AMP (~~LOC~~) NOT AT ARMC
Chlamydia: NEGATIVE
NEISSERIA GONORRHEA: NEGATIVE

## 2018-01-28 ENCOUNTER — Telehealth: Payer: Self-pay | Admitting: *Deleted

## 2018-01-28 NOTE — Telephone Encounter (Signed)
Post ED Visit - Positive Culture Follow-up  Culture report reviewed by antimicrobial stewardship pharmacist:  []  Emily Bennett, Pharm.D. []  Emily Bennett, Pharm.D., BCPS AQ-ID []  Emily Bennett, Pharm.D., BCPS []  Emily Bennett, Pharm.D., BCPS []  PinelandMinh Bennett, [address] BoulevardPharm.D., BCPS, AAHIVP []  Estella HuskMichelle Bennett, Pharm.D., BCPS, AAHIVP []  Emily Bennett, PharmD, BCPS []  Emily Bennett, PharmD []  Pollyann SamplesAndy Bennett, PharmD, BCPS Adline PotterSabrina Bennett, PharmD  Positive urine culture Treated with Cephalexin, organism sensitive to the same and no further patient follow-up is required at this time.  Emily Bennett, Emily Bennett 01/28/2018, 10:58 AM

## 2020-11-24 ENCOUNTER — Encounter (HOSPITAL_BASED_OUTPATIENT_CLINIC_OR_DEPARTMENT_OTHER): Payer: Self-pay

## 2020-11-24 ENCOUNTER — Emergency Department (HOSPITAL_BASED_OUTPATIENT_CLINIC_OR_DEPARTMENT_OTHER)
Admission: EM | Admit: 2020-11-24 | Discharge: 2020-11-24 | Disposition: A | Discharge status: Home / Self Care | Payer: HRSA Program | Attending: Emergency Medicine | Admitting: Emergency Medicine

## 2020-11-24 ENCOUNTER — Other Ambulatory Visit: Payer: Self-pay

## 2020-11-24 DIAGNOSIS — R509 Fever, unspecified: Secondary | ICD-10-CM | POA: Insufficient documentation

## 2020-11-24 DIAGNOSIS — R059 Cough, unspecified: Secondary | ICD-10-CM | POA: Insufficient documentation

## 2020-11-24 DIAGNOSIS — M791 Myalgia, unspecified site: Secondary | ICD-10-CM | POA: Diagnosis not present

## 2020-11-24 DIAGNOSIS — Z20822 Contact with and (suspected) exposure to covid-19: Secondary | ICD-10-CM | POA: Insufficient documentation

## 2020-11-24 LAB — SARS CORONAVIRUS 2 (TAT 6-24 HRS): SARS Coronavirus 2: NEGATIVE

## 2020-11-24 MED ORDER — BENZONATATE 100 MG PO CAPS: 100.0000 mg | ORAL_CAPSULE | Freq: Three times a day (TID) | ORAL | 0 refills | Status: DC

## 2020-11-24 MED ORDER — FLUTICASONE PROPIONATE 50 MCG/ACT NA SUSP: 2.0000 | Freq: Every day | NASAL | 0 refills | Status: DC

## 2020-11-24 NOTE — ED Provider Notes (Signed)
[facility] EMERGENCY DEPARTMENT Provider Note   CSN: [id number] Arrival date & time: 11/24/20  1211     History Chief Complaint  Patient presents with  . Cough    Emily Bennett is a 29 y.o. female with no significant past medical history who presents for evaluation of possible Covid infection.  She is vaccinated however not boosted.  Was around people at Christmas time to for known positive with Covid.  States she has had some myalgias, fever, cough.  Fever resolved 2 days ago.  Has not take anything for symptoms.  She denies any congestion, rhinorrhea, sore throat, neck stiffness, neck stiffness, chest pain, shortness of breath, abdominal pain, diarrhea, dysuria. Denies additional aggravating or alleviating factors.  History obtained from patient and past medical records.  No interpreter used  HPI     History reviewed. No pertinent past medical history.  There are no problems to display for this patient.   History reviewed. No pertinent surgical history.   OB History    Gravida  1   Para      Term      Preterm      AB      Living        SAB      IAB      Ectopic      Multiple      Live Births              No family history on file.  Social History   Tobacco Use  . Smoking status: Never Smoker  . Smokeless tobacco: Never Used  Vaping Use  . Vaping Use: Never used  Substance Use Topics  . Alcohol use: Yes    Comment: occ  . Drug use: No    Home Medications Prior to Admission medications   Medication Sig Start Date End Date Taking? Authorizing Provider  benzonatate (TESSALON) 100 MG capsule Take 1 capsule (100 mg total) by mouth every 8 (eight) hours. 11/24/20  Yes Kaspar Albornoz A, PA-C  fluticasone (FLONASE) 50 MCG/ACT nasal spray Place 2 sprays into both nostrils daily. 11/24/20  Yes Oddie Kuhlmann A, PA-C  cetirizine (ZYRTEC) 10 MG tablet Take 1 tablet (10 mg total) by mouth daily. 11/08/17 11/24/20  Caccavale, Sophia,  PA-C    Allergies    Patient has no known allergies.  Review of Systems   Review of Systems  Constitutional: Positive for fatigue and fever.  HENT: Negative.   Respiratory: Positive for cough. Negative for apnea, choking, chest tightness, shortness of breath, wheezing and stridor.   Cardiovascular: Negative.   Gastrointestinal: Negative.   Genitourinary: Negative.   Musculoskeletal: Negative.   Skin: Negative.   Neurological: Negative.   All other systems reviewed and are negative.   Physical Exam Updated Vital Signs BP 124/78 (BP Location: Left Arm)   Pulse 85   Temp 98.7 F (37.1 C) (Oral)   Resp 18   Ht 5\' 3"  (1.6 m)   Wt 65.8 kg   LMP 10/25/2020   SpO2 97%   Breastfeeding Unknown   BMI 25.69 kg/m   Physical Exam Vitals and nursing note reviewed.  Constitutional:      General: She is not in acute distress.    Appearance: She is not ill-appearing, toxic-appearing or diaphoretic.  HENT:     Head: Normocephalic and atraumatic.     Jaw: There is normal jaw occlusion.     Ears:     Comments: No Mastoid tenderness.  Nose:     Comments: Clear rhinorrhea and congestion to bilateral nares.  No sinus tenderness.    Mouth/Throat:     Comments: Posterior oropharynx clear.  Mucous membranes moist.  Tonsils without erythema or exudate.  Uvula midline without deviation.  No evidence of PTA or RPA.  No drooling, dysphasia or trismus.  Phonation normal. Neck:     Trachea: Trachea and phonation normal.     Comments: No Neck stiffness or neck rigidity.  Cardiovascular:     Rate and Rhythm: Normal rate.     Pulses: Normal pulses.     Heart sounds: Normal heart sounds.     Comments: No murmurs rubs or gallops. Pulmonary:     Comments: Clear to auscultation bilaterally without wheeze, rhonchi or rales.  No accessory muscle usage.  Able speak in full sentences. Abdominal:     Comments: Soft, nontender without rebound or guarding.  No CVA tenderness.  Musculoskeletal:      Cervical back: Full passive range of motion without pain and normal range of motion.     Comments: Moves all 4 extremities without difficulty.  Lower extremities without edema, erythema or warmth.  Skin:    Comments: Brisk capillary refill.  No rashes or lesions.  Neurological:     Mental Status: She is alert.     Comments: Ambulatory in department without difficulty.  Cranial nerves II through XII grossly intact.  No facial droop.  No aphasia.     ED Results / Procedures / Treatments   Labs (all labs ordered are listed, but only abnormal results are displayed) Labs Reviewed  SARS CORONAVIRUS 2 (TAT 6-24 HRS)    EKG None  Radiology No results found.  Procedures Procedures (including critical care time)  Medications Ordered in ED Medications - No data to display  ED Course  I have reviewed the triage vital signs and the nursing notes.  Pertinent labs & imaging results that were available during my care of the patient were reviewed by me and considered in my medical decision making (see chart for details).  29 year old presents for evaluation of possible Covid.  She is vaccinated however not boosted.  Known positive exposures.  She is afebrile, nonseptic, non-ill-appearing.  Appears clinically well-hydrated.  Heart and lungs are clear.  Her abdomen is soft, nontender.  She is no neck stiffness or neck rigidity.  She has no meningismus.  She has a nonfocal neuro exam without deficits.  Compartments are soft.  No clinical evidence of DVT.  She has no tachycardia, tachypnea or hypoxia.   Covid test obtained here in the ED.  Patient has my chart.  She will follow-up for results.  She will return for any worsening symptoms.  The patient has been appropriately medically screened and/or stabilized in the ED. I have low suspicion for any other emergent medical condition which would require further screening, evaluation or treatment in the ED or require inpatient management.  Patient  is hemodynamically stable and in no acute distress.  Patient able to ambulate in department prior to ED.  Evaluation does not show acute pathology that would require ongoing or additional emergent interventions while in the emergency department or further inpatient treatment.  I have discussed the diagnosis with the patient and answered all questions.  Pain is been managed while in the emergency department and patient has no further complaints prior to discharge.  Patient is comfortable with plan discussed in room and is stable for discharge at this time.  I  have discussed strict return precautions for returning to the emergency department.  Patient was encouraged to follow-up with PCP/specialist refer to at discharge.    MDM Rules/Calculators/A&P                          Emily Bennett was evaluated in Emergency Department on 11/24/2020 for the symptoms described in the history of present illness. She was evaluated in the context of the global COVID-19 pandemic, which necessitated consideration that the patient might be at risk for infection with the SARS-CoV-2 virus that causes COVID-19. Institutional protocols and algorithms that pertain to the evaluation of patients at risk for COVID-19 are in a state of rapid change based on information released by regulatory bodies including the CDC and federal and state organizations. These policies and algorithms were followed during the patient's care in the ED. Final Clinical Impression(s) / ED Diagnoses Final diagnoses:  Person under investigation for COVID-19    Rx / DC Orders ED Discharge Orders         Ordered    benzonatate (TESSALON) 100 MG capsule  Every 8 hours        11/24/20 1404    fluticasone (FLONASE) 50 MCG/ACT nasal spray  Daily        11/24/20 1404           Tanner Yeley A, PA-C 11/24/20 1406    Vanetta Mulders, MD 12/04/20 0001

## 2020-11-24 NOTE — ED Triage Notes (Signed)
Pt c/o flu like sx x 3 days with +covid exposure-NAD-steady gait

## 2020-11-24 NOTE — Discharge Instructions (Signed)
Covid test was pending at discharge If positive will need to quarantine at home for 1 week. Return for new or worsening symptoms.

## 2021-04-03 ENCOUNTER — Encounter (HOSPITAL_BASED_OUTPATIENT_CLINIC_OR_DEPARTMENT_OTHER): Payer: Self-pay

## 2021-04-03 ENCOUNTER — Emergency Department (HOSPITAL_BASED_OUTPATIENT_CLINIC_OR_DEPARTMENT_OTHER)
Admission: EM | Admit: 2021-04-03 | Discharge: 2021-04-04 | Disposition: A | Discharge status: Home / Self Care | Payer: Self-pay | Attending: Emergency Medicine | Admitting: Emergency Medicine

## 2021-04-03 ENCOUNTER — Emergency Department (HOSPITAL_BASED_OUTPATIENT_CLINIC_OR_DEPARTMENT_OTHER): Payer: Self-pay

## 2021-04-03 ENCOUNTER — Other Ambulatory Visit: Payer: Self-pay

## 2021-04-03 DIAGNOSIS — M549 Dorsalgia, unspecified: Secondary | ICD-10-CM | POA: Insufficient documentation

## 2021-04-03 DIAGNOSIS — Y92410 Unspecified street and highway as the place of occurrence of the external cause: Secondary | ICD-10-CM | POA: Insufficient documentation

## 2021-04-03 LAB — PREGNANCY, URINE: Preg Test, Ur: NEGATIVE

## 2021-04-03 NOTE — ED Notes (Signed)
Pt given collection cup for preg/ u/a
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
## 2021-04-04 ENCOUNTER — Encounter (HOSPITAL_BASED_OUTPATIENT_CLINIC_OR_DEPARTMENT_OTHER): Payer: Self-pay | Admitting: Emergency Medicine

## 2021-04-04 MED ORDER — METAXALONE 800 MG PO TABS: 800.0000 mg | ORAL_TABLET | Freq: Three times a day (TID) | ORAL | 0 refills | Status: DC

## 2021-04-04 MED ORDER — ACETAMINOPHEN 500 MG PO TABS
1000.0000 mg | ORAL_TABLET | Freq: Once | ORAL | Status: AC
  Administered 2021-04-04: 1000 mg via ORAL
  Filled 2021-04-04: qty 2

## 2021-04-04 MED ORDER — IBUPROFEN 800 MG PO TABS
800.0000 mg | ORAL_TABLET | Freq: Once | ORAL | Status: AC
  Administered 2021-04-04: 800 mg via ORAL
  Filled 2021-04-04: qty 1

## 2021-04-04 MED ORDER — NAPROXEN 375 MG PO TABS: 375.0000 mg | ORAL_TABLET | Freq: Two times a day (BID) | ORAL | 0 refills | Status: DC
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
## 2021-09-10 ENCOUNTER — Other Ambulatory Visit: Payer: Self-pay

## 2021-09-10 ENCOUNTER — Emergency Department (HOSPITAL_BASED_OUTPATIENT_CLINIC_OR_DEPARTMENT_OTHER)
Admission: EM | Admit: 2021-09-10 | Discharge: 2021-09-10 | Disposition: A | Discharge status: Home / Self Care | Payer: Medicaid Other | Attending: Emergency Medicine | Admitting: Emergency Medicine

## 2021-09-10 ENCOUNTER — Encounter (HOSPITAL_BASED_OUTPATIENT_CLINIC_OR_DEPARTMENT_OTHER): Payer: Self-pay

## 2021-09-10 DIAGNOSIS — N898 Other specified noninflammatory disorders of vagina: Secondary | ICD-10-CM

## 2021-09-10 DIAGNOSIS — B9689 Other specified bacterial agents as the cause of diseases classified elsewhere: Secondary | ICD-10-CM | POA: Insufficient documentation

## 2021-09-10 DIAGNOSIS — N760 Acute vaginitis: Secondary | ICD-10-CM | POA: Insufficient documentation

## 2021-09-10 LAB — WET PREP, GENITAL
Sperm: NONE SEEN
Trich, Wet Prep: NONE SEEN
WBC, Wet Prep HPF POC: NONE SEEN
Yeast Wet Prep HPF POC: NONE SEEN

## 2021-09-10 LAB — HIV ANTIBODY (ROUTINE TESTING W REFLEX): HIV Screen 4th Generation wRfx: NONREACTIVE

## 2021-09-10 LAB — PREGNANCY, URINE: Preg Test, Ur: NEGATIVE

## 2021-09-10 MED ORDER — METRONIDAZOLE 500 MG PO TABS: 500.0000 mg | ORAL_TABLET | Freq: Two times a day (BID) | ORAL | 0 refills | Status: DC

## 2021-09-10 MED ORDER — VALACYCLOVIR HCL 1 G PO TABS: 1000.0000 mg | ORAL_TABLET | Freq: Two times a day (BID) | ORAL | 0 refills | Status: DC

## 2021-09-10 MED ORDER — LIDOCAINE 5 % EX OINT: 1.0000 "application " | TOPICAL_OINTMENT | CUTANEOUS | 0 refills | Status: DC | PRN

## 2021-09-10 NOTE — ED Provider Notes (Signed)
[facility] EMERGENCY DEPARTMENT Provider Note   CSN: [id number] Arrival date & time: 09/10/21  1442     History Chief Complaint  Patient presents with   Vaginal Pain    Emily Bennett is a 30 y.o. female presents to the emergency department for 2 to 3 days of constant burning, vaginal rash on the right lower labia.  Pain exacerbated by palpation.  No relieving factors.  She denies any fevers, chills, night sweats, abdominal pain, dysuria, hematuria, vaginal discharge.  She denies any vaginal or pelvic pain.  She reports she is orally and vaginally sexually active with female partners only and does not use protection.  She does not know the STD status of her previous partners.  She denies any medical history, surgical history.  Denies daily medications.  Uses the estrogen patch for birth control.  No known drug allergies.  Denies any tobacco, EtOH, or drug use.   Vaginal Pain Pertinent negatives include no chest pain, no abdominal pain and no shortness of breath.      History reviewed. No pertinent past medical history.  There are no problems to display for this patient.   History reviewed. No pertinent surgical history.   OB History     Gravida  1   Para      Term      Preterm      AB      Living         SAB      IAB      Ectopic      Multiple      Live Births              History reviewed. No pertinent family history.  Social History   Tobacco Use   Smoking status: Never   Smokeless tobacco: Never  Vaping Use   Vaping Use: Never used  Substance Use Topics   Alcohol use: Yes    Comment: occ   Drug use: No    Home Medications Prior to Admission medications   Medication Sig Start Date End Date Taking? Authorizing Provider  metroNIDAZOLE (FLAGYL) 500 MG tablet Take 1 tablet (500 mg total) by mouth 2 (two) times daily. 09/10/21  Yes Achille Rich, PA-C  UNABLE TO FIND Med Name: ZAF birth control   Yes [provider]   valACYclovir (VALTREX) 1000 MG tablet Take 1 tablet (1,000 mg total) by mouth 2 (two) times daily. 09/10/21  Yes Achille Rich, PA-C  benzonatate (TESSALON) 100 MG capsule Take 1 capsule (100 mg total) by mouth every 8 (eight) hours. 11/24/20   Henderly, Britni A, PA-C  fluticasone (FLONASE) 50 MCG/ACT nasal spray Place 2 sprays into both nostrils daily. 11/24/20   Henderly, Britni A, PA-C  metaxalone (SKELAXIN) 800 MG tablet Take 1 tablet (800 mg total) by mouth 3 (three) times daily. 04/04/21   Palumbo, April, MD  naproxen (NAPROSYN) 375 MG tablet Take 1 tablet (375 mg total) by mouth 2 (two) times daily with a meal. 04/04/21   Palumbo, April, MD  cetirizine (ZYRTEC) 10 MG tablet Take 1 tablet (10 mg total) by mouth daily. 11/08/17 11/24/20  Caccavale, Sophia, PA-C    Allergies    Patient has no known allergies.  Review of Systems   Review of Systems  Constitutional:  Negative for chills, fatigue, fever and unexpected weight change.  HENT:  Negative for ear pain and sore throat.   Eyes:  Negative for pain and visual disturbance.  Respiratory:  Negative for cough and shortness of breath.   Cardiovascular:  Negative for chest pain and palpitations.  Gastrointestinal:  Negative for abdominal pain and vomiting.  Genitourinary:  Positive for genital sores. Negative for decreased urine volume, dysuria, hematuria, pelvic pain, vaginal bleeding, vaginal discharge and vaginal pain.  Musculoskeletal:  Negative for arthralgias and back pain.  Skin:  Negative for color change and rash.  Neurological:  Negative for seizures and syncope.  All other systems reviewed and are negative.  Physical Exam Updated Vital Signs BP 126/78 (BP Location: Right Arm)   Pulse 75   Temp 98.7 F (37.1 C) (Oral)   Resp 14   Ht 5\' 3"  (1.6 m)   Wt 72.1 kg   LMP 08/10/2021 (Exact Date)   SpO2 100%   BMI 28.17 kg/m   Physical Exam Vitals and nursing note reviewed. Exam conducted with a chaperone present 08/12/2021,  Fleet Contras).  Constitutional:      Appearance: Normal appearance.  HENT:     Head: Normocephalic and atraumatic.  Eyes:     General: No scleral icterus. Cardiovascular:     Rate and Rhythm: Normal rate and regular rhythm.  Pulmonary:     Effort: Pulmonary effort is normal.     Breath sounds: Normal breath sounds.  Abdominal:     General: Abdomen is flat. Bowel sounds are normal.     Palpations: Abdomen is soft.  Genitourinary:    Exam position: Lithotomy position.     Labia:        Right: Rash and tenderness present.        Comments: 3 punctate ulcerations and within close activity of each other on the right lower labia majora.  No obvious weeping.  Copious amounts of white/yellow discharge on the vulva and vaginal vault.  No chandelier sign.  No CMT.  Vaginal mucosa pink without lesions. Musculoskeletal:        General: No deformity.     Cervical back: Normal range of motion.  Skin:    General: Skin is warm and dry.  Neurological:     General: No focal deficit present.     Mental Status: She is alert. Mental status is at baseline.    ED Results / Procedures / Treatments   Labs (all labs ordered are listed, but only abnormal results are displayed) Labs Reviewed  WET PREP, GENITAL - Abnormal; Notable for the following components:      Result Value   Clue Cells Wet Prep HPF POC PRESENT (*)    All other components within normal limits  HSV CULTURE AND TYPING  PREGNANCY, URINE  RAPID HIV SCREEN (HIV 1/2 AB+AG)  RPR  GC/CHLAMYDIA PROBE AMP ([place]) NOT AT [facility]    EKG None  Radiology No results found.  Procedures Procedures   Medications Ordered in ED Medications - No data to display  ED Course  I have reviewed the triage vital signs and the nursing notes.  Pertinent labs & imaging results that were available during my care of the patient were reviewed by me and considered in my medical decision making (see chart for details).  Based on physical exam findings  and patient's unprotected sexual intercourse, rash is consistent with genital herpes.  Will empirically treat with Valtrex.  I personally interpreted and reviewed the patient's labs.  Wet prep positive for clue cells.  Exam consistent with BV.  We will treat.  Pregnancy test negative.  Additionally, I ordered rapid HIV, RPR, HSV culture, and GC/chlamydia.  I discussed with the patient that these will populate in later time on her MyChart.  Discussed with the patient the lab findings.  Recommended she abstain from sexual intercourse until her pending labs result.  Included information on safe sex practices.  Topical lidocaine was on covered by her insurance, so recommended OTC lidocaine.  Patient prescribed metronidazole for BV, and Valtrex for possible genital herpes.  Return precautions given.  Patient agrees to plan.  Patient is stable be discharged home in good condition.  I discussed with the patient lab and imagingMDM Rules/Calculators/A&P                          Final Clinical Impression(s) / ED Diagnoses Final diagnoses:  Vaginal lesion  BV (bacterial vaginosis)    Rx / DC Orders ED Discharge Orders          Ordered    lidocaine (XYLOCAINE) 5 % ointment  As needed,   Status:  Discontinued        09/10/21 1642    metroNIDAZOLE (FLAGYL) 500 MG tablet  2 times daily        09/10/21 1741    valACYclovir (VALTREX) 1000 MG tablet  2 times daily        09/10/21 1741             Achille Rich, PA-C 09/10/21 1748    Gloris Manchester, MD 09/12/21 1538

## 2021-09-10 NOTE — Discharge Instructions (Addendum)
You are seen here today for evaluation of a vaginal rash.  Is a possibility that this may be genital herpes.  Please avoid sexual intercourse until lab results have populated.  Pick up over-the-counter topical lidocaine to use on the areas for pain relief if needed.  Make sure to apply a small amount and to keep it externally, and not internally.  Additionally, you been prescribed Valtrex, an antiviral, to take twice daily for the next 10 days.  If any of your results are positive, you want to inform your sexual partners as these are contagious.  Additionally, your lab results bacterial vaginosis.  You will be prescribed metronidazole to take twice daily for next 7 days, it is very important to not drink with this medication as you will become severely ill.  If you have any new or worsening symptoms, please report to the nearest emergency department.

## 2021-09-10 NOTE — ED Triage Notes (Signed)
Pt c/o vaginal pain x 2 days. States there are "3 bumps" that feel like a scratch. Denies vaginal discharge or risk of STD.

## 2021-09-11 LAB — GC/CHLAMYDIA PROBE AMP (~~LOC~~) NOT AT ARMC
Chlamydia: NEGATIVE
Comment: NEGATIVE
Comment: NORMAL
Neisseria Gonorrhea: NEGATIVE

## 2021-09-11 LAB — RPR: RPR Ser Ql: NONREACTIVE

## 2021-09-13 LAB — HSV CULTURE AND TYPING

## 2022-02-22 ENCOUNTER — Encounter (HOSPITAL_BASED_OUTPATIENT_CLINIC_OR_DEPARTMENT_OTHER): Payer: Self-pay

## 2022-02-22 ENCOUNTER — Emergency Department (HOSPITAL_BASED_OUTPATIENT_CLINIC_OR_DEPARTMENT_OTHER)
Admission: EM | Admit: 2022-02-22 | Discharge: 2022-02-22 | Disposition: A | Discharge status: Home / Self Care | Payer: PRIVATE HEALTH INSURANCE | Attending: Emergency Medicine | Admitting: Emergency Medicine

## 2022-02-22 ENCOUNTER — Emergency Department (HOSPITAL_BASED_OUTPATIENT_CLINIC_OR_DEPARTMENT_OTHER): Payer: PRIVATE HEALTH INSURANCE

## 2022-02-22 ENCOUNTER — Other Ambulatory Visit: Payer: Self-pay

## 2022-02-22 DIAGNOSIS — R0602 Shortness of breath: Secondary | ICD-10-CM | POA: Diagnosis not present

## 2022-02-22 DIAGNOSIS — Z8616 Personal history of COVID-19: Secondary | ICD-10-CM | POA: Insufficient documentation

## 2022-02-22 DIAGNOSIS — R002 Palpitations: Secondary | ICD-10-CM

## 2022-02-22 LAB — BASIC METABOLIC PANEL
Anion gap: 7 (ref 5–15)
BUN: 11 mg/dL (ref 6–20)
CO2: 28 mmol/L (ref 22–32)
Calcium: 9.1 mg/dL (ref 8.9–10.3)
Chloride: 102 mmol/L (ref 98–111)
Creatinine, Ser: 1.04 mg/dL — ABNORMAL HIGH (ref 0.44–1.00)
GFR, Estimated: >60 mL/min (ref >60)
Glucose, Bld: 94 mg/dL (ref 70–99)
Potassium: 3.4 mmol/L — ABNORMAL LOW (ref 3.5–5.1)
Sodium: 137 mmol/L (ref 135–145)

## 2022-02-22 LAB — CBC WITH DIFFERENTIAL/PLATELET
Abs Immature Granulocytes: 0.01 10*3/uL (ref 0.00–0.07)
Basophils Absolute: 0 10*3/uL (ref 0.0–0.1)
Basophils Relative: 1 %
Eosinophils Absolute: 0.2 10*3/uL (ref 0.0–0.5)
Eosinophils Relative: 4 %
HCT: 41.6 % (ref 36.0–46.0)
Hemoglobin: 13.5 g/dL (ref 12.0–15.0)
Immature Granulocytes: 0 %
Lymphocytes Relative: 31 %
Lymphs Abs: 1.6 10*3/uL (ref 0.7–4.0)
MCH: 27 pg (ref 26.0–34.0)
MCHC: 32.5 g/dL (ref 30.0–36.0)
MCV: 83.2 fL (ref 80.0–100.0)
Monocytes Absolute: 0.6 10*3/uL (ref 0.1–1.0)
Monocytes Relative: 11 %
Neutro Abs: 2.7 10*3/uL (ref 1.7–7.7)
Neutrophils Relative %: 53 %
Platelets: 212 10*3/uL (ref 150–400)
RBC: 5 MIL/uL (ref 3.87–5.11)
RDW: 13.2 % (ref 11.5–15.5)
WBC: 5.1 10*3/uL (ref 4.0–10.5)
nRBC: 0 % (ref 0.0–0.2)

## 2022-02-22 LAB — D-DIMER, QUANTITATIVE: D-Dimer, Quant: <0.27 ug/mL-FEU (ref 0.00–0.50)

## 2022-02-22 LAB — PREGNANCY, URINE: Preg Test, Ur: NEGATIVE

## 2022-02-22 MED ORDER — ALBUTEROL SULFATE HFA 108 (90 BASE) MCG/ACT IN AERS
INHALATION_SPRAY | RESPIRATORY_TRACT | Status: AC
  Filled 2022-02-22: qty 6.7

## 2022-02-22 MED ORDER — ALBUTEROL SULFATE HFA 108 (90 BASE) MCG/ACT IN AERS
2.0000 | INHALATION_SPRAY | Freq: Once | RESPIRATORY_TRACT | Status: AC
  Administered 2022-02-22: 2 via RESPIRATORY_TRACT

## 2022-02-22 NOTE — ED Provider Notes (Signed)
?[facility] EMERGENCY DEPARTMENT ?Provider Note ? ? ?CSN: [id number] ?Arrival date & time: 02/22/22  2505 ? ?  ? ?History ? ?Chief Complaint  ?Patient presents with  ? Shortness of Breath  ? ? ?Emily Bennett is a 31 y.o. female presented emerged department with shortness of breath.  The patient reports abrupt onset of shortness of breath yesterday while at rest.  She says she feels very winded like she cannot catch her breath.  She denies any chest pain.  She reports he does feel some occasional palpitations of her heart.  She denies any leg swelling, any coughing or fevers.  She denies any history of DVT or PE.  She is on a type of birth control and reports it is " a patch" but unaware if it contains estrogen ? ?She reports she had COVID about 1 to 2 months ago, had a fairly mild illness overall with no serious respiratory issues.  She denies history of asthma or underlying lung problems. ? ?HPI ? ?  ? ?Home Medications ?Prior to Admission medications   ?Medication Sig Start Date End Date Taking? Authorizing Provider  ?benzonatate (TESSALON) 100 MG capsule Take 1 capsule (100 mg total) by mouth every 8 (eight) hours. 11/24/20   Henderly, Britni A, PA-C  ?fluticasone (FLONASE) 50 MCG/ACT nasal spray Place 2 sprays into both nostrils daily. 11/24/20   Henderly, Britni A, PA-C  ?metaxalone (SKELAXIN) 800 MG tablet Take 1 tablet (800 mg total) by mouth 3 (three) times daily. 04/04/21   Palumbo, April, MD  ?metroNIDAZOLE (FLAGYL) 500 MG tablet Take 1 tablet (500 mg total) by mouth 2 (two) times daily. 09/10/21   Achille Rich, PA-C  ?naproxen (NAPROSYN) 375 MG tablet Take 1 tablet (375 mg total) by mouth 2 (two) times daily with a meal. 04/04/21   Palumbo, April, MD  ?UNABLE TO FIND Med Name: ZAF birth control    [provider]  ?valACYclovir (VALTREX) 1000 MG tablet Take 1 tablet (1,000 mg total) by mouth 2 (two) times daily. 09/10/21   Achille Rich, PA-C  ?cetirizine (ZYRTEC) 10 MG tablet Take 1  tablet (10 mg total) by mouth daily. 11/08/17 11/24/20  Caccavale, Sophia, PA-C  ?   ? ?Allergies    ?Patient has no known allergies.   ? ?Review of Systems   ?Review of Systems ? ?Physical Exam ?Updated Vital Signs ?BP 134/88   Pulse 73   Temp 98.3 ?F (36.8 ?C) (Oral)   Resp 16   Ht 5\' 3"  (1.6 m)   Wt 68.5 kg   LMP 02/01/2022 (Approximate)   SpO2 100%   BMI 26.75 kg/m?  ?Physical Exam ?Constitutional:   ?   General: She is not in acute distress. ?HENT:  ?   Head: Normocephalic and atraumatic.  ?Eyes:  ?   Conjunctiva/sclera: Conjunctivae normal.  ?   Pupils: Pupils are equal, round, and reactive to light.  ?Cardiovascular:  ?   Rate and Rhythm: Normal rate and regular rhythm.  ?Pulmonary:  ?   Effort: Pulmonary effort is normal. No respiratory distress.  ?Abdominal:  ?   General: There is no distension.  ?   Tenderness: There is no abdominal tenderness.  ?Musculoskeletal:  ?   Right lower leg: No edema.  ?   Left lower leg: No edema.  ?Skin: ?   General: Skin is warm and dry.  ?Neurological:  ?   General: No focal deficit present.  ?   Mental Status: She is alert. Mental  status is at baseline.  ?Psychiatric:     ?   Mood and Affect: Mood normal.     ?   Behavior: Behavior normal.  ? ? ?ED Results / Procedures / Treatments   ?Labs ?(all labs ordered are listed, but only abnormal results are displayed) ?Labs Reviewed  ?CBC WITH DIFFERENTIAL/PLATELET  ?PREGNANCY, URINE  ?D-DIMER, QUANTITATIVE  ?BASIC METABOLIC PANEL  ? ? ?EKG ?EKG Interpretation ? ?Date/Time:  Thursday February 22 2022 09:47:23 EDT ?Ventricular Rate:  68 ?PR Interval:  119 ?QRS Duration: 89 ?QT Interval:  397 ?QTC Calculation: 423 ?R Axis:   55 ?Text Interpretation: Sinus rhythm Borderline short PR interval Confirmed by Alvester Chourifan, Sue Fernicola [phone]([id number]) on 02/22/2022 9:58:30 AM ? ?Radiology ?DG Chest 2 View ? ?Result Date: 02/22/2022 ?CLINICAL DATA:  Shortness of breath. EXAM: CHEST - 2 VIEW COMPARISON:  AP chest 04/03/2021 FINDINGS: Cardiac silhouette and  mediastinal contours are within normal limits. The lungs are clear. No pleural effusion or pneumothorax. No acute skeletal abnormality. IMPRESSION: No active cardiopulmonary disease. Electronically Signed   By: Neita Garnetonald  Viola M.D.   On: 02/22/2022 09:41   ? ?Procedures ?Procedures  ? ? ?Medications Ordered in ED ?Medications - No data to display ? ?ED Course/ Medical Decision Making/ A&P ?Clinical Course as of 02/22/22 1058  ?Thu Feb 22, 2022  ?1002 D-Dimer, Quant: <0.27 [MT]  ?1057 Reviewed the work-up with the patient.  She remained stable on room air and breathing comfortably in the room.  Okay for discharge at this time.  She was requesting an inhaler which she has used in the past, I think this is reasonable, although I do not hear any significant wheezing to suggest this asthma.  We can provide her an inhaler to leave with [MT]  ?  ?Clinical Course User Index ?[MT] Terald Sleeperrifan, Marshawn Ninneman J, MD  ? ?                        ?Medical Decision Making ?Amount and/or Complexity of Data Reviewed ?Labs: ordered. Decision-making details documented in ED Course. ?Radiology: ordered. ?ECG/medicine tests: ordered. ? ?Risk ?Prescription drug management. ? ? ?This patient presents to the ED with concern for shortness of breath. This involves an extensive number of treatment options, and is a complaint that carries with it a high risk of complications and morbidity.  The differential diagnosis includes pneumonia versus anemia versus PE vs PTX vs other ? ?No wheezing on exam to suggest asthma exacerbation ? ?I ordered and personally interpreted labs.  The pertinent results include: Unremarkable and negative D-dimer, CBC and BMP within normal limit ? ?I ordered imaging studies including dg chest ?I independently visualized and interpreted imaging which showed no focal infiltrate or evidence of pneumothorax ?I agree with the radiologist interpretation ? ?The patient was maintained on a cardiac monitor.  I personally reviewed and  interpreted the cardiac monitored which showed an underlying rhythm of: Normal sinus rhythm ? ?Per my interpretation the patient's ECG shows normal sinus rhythm without acute ischemic findings or evidence of significant arrhythmia ? ? ?Test Considered:  ?-With a negative D-dimer and no acute risk factors, I have a lower suspicion for acute PE at this time and do not feel that CT angiogram scan was emergently indicated ? ?After the interventions noted above, I reevaluated the patient and found that they have: stayed the same ? ? ?Dispostion: ? ?After consideration of the diagnostic results and the patients response to treatment, I feel that the  patent would benefit from PCP f/u. ? ? ? ? ? ? ? ? ?Final Clinical Impression(s) / ED Diagnoses ?Final diagnoses:  ?None  ? ? ?Rx / DC Orders ?ED Discharge Orders   ? ? None  ? ?  ? ? ?  ?Terald Sleeper, MD ?02/22/22 1058 ? ?

## 2022-02-22 NOTE — Discharge Instructions (Addendum)
Your x-ray and blood tests and EKG were reassuring today, did not show signs of emergency as a cause of your shortness of breath.  Specifically we did not see signs of pneumonia, collapsed lung, serious infection, blood clots, or anemia based on your blood work.  You should follow-up with your primary care clinic on Monday if continue having symptoms. ?

## 2022-02-22 NOTE — ED Triage Notes (Signed)
Pt reports she feels as if she cant breath x 1 day Positive for covid approx 2 months ago. Pt denies cough/ fever ?

## 2022-05-23 ENCOUNTER — Other Ambulatory Visit: Payer: Self-pay

## 2022-05-23 DIAGNOSIS — M79604 Pain in right leg: Secondary | ICD-10-CM | POA: Insufficient documentation

## 2022-05-23 NOTE — ED Triage Notes (Signed)
Pt c/o anterior right thigh pain x 2 week. Pt ambulatory with steady gait. Denies injury.

## 2022-05-24 ENCOUNTER — Encounter (HOSPITAL_BASED_OUTPATIENT_CLINIC_OR_DEPARTMENT_OTHER): Payer: Self-pay | Admitting: Emergency Medicine

## 2022-05-24 ENCOUNTER — Other Ambulatory Visit (HOSPITAL_BASED_OUTPATIENT_CLINIC_OR_DEPARTMENT_OTHER): Payer: Self-pay

## 2022-05-24 ENCOUNTER — Emergency Department (HOSPITAL_BASED_OUTPATIENT_CLINIC_OR_DEPARTMENT_OTHER)
Admission: EM | Admit: 2022-05-24 | Discharge: 2022-05-24 | Disposition: A | Discharge status: Home / Self Care | Payer: PRIVATE HEALTH INSURANCE | Attending: Emergency Medicine | Admitting: Emergency Medicine

## 2022-05-24 DIAGNOSIS — T148XXA Other injury of unspecified body region, initial encounter: Secondary | ICD-10-CM

## 2022-05-24 HISTORY — DX: Scoliosis, unspecified: M41.9

## 2022-05-24 MED ORDER — IBUPROFEN 800 MG PO TABS
800.0000 mg | ORAL_TABLET | Freq: Once | ORAL | Status: AC
  Administered 2022-05-24: 800 mg via ORAL
  Filled 2022-05-24: qty 1

## 2022-05-24 MED ORDER — ACETAMINOPHEN 500 MG PO TABS
1000.0000 mg | ORAL_TABLET | Freq: Once | ORAL | Status: AC
  Administered 2022-05-24: 1000 mg via ORAL
  Filled 2022-05-24: qty 2

## 2022-05-24 MED ORDER — IBUPROFEN 400 MG PO TABS
400.0000 mg | ORAL_TABLET | Freq: Four times a day (QID) | ORAL | 0 refills | Status: DC | PRN
  Filled 2022-05-24: qty 20, 5d supply, fill #0

## 2022-05-24 NOTE — ED Provider Notes (Signed)
[facility] EMERGENCY DEPARTMENT Provider Note   CSN: [id number] Arrival date & time: 05/23/22  2342     History  Chief Complaint  Patient presents with   Leg Pain    Emily Bennett is a 31 y.o. female.  The history is provided by the patient.  Leg Pain Lower extremity pain location: anterior thigh. Injury: no   Pain details:    Quality:  Aching   Radiates to:  Does not radiate   Severity:  Moderate   Onset quality:  Sudden   Duration:  2 weeks   Timing:  Constant   Progression:  Unchanged Chronicity:  New Dislocation: no   Prior injury to area:  No Relieved by:  Nothing Exacerbated by: walking up and down. Ineffective treatments:  None tried Associated symptoms: no back pain, no fever, no muscle weakness, no neck pain and no swelling   Risk factors: no concern for non-accidental trauma        Home Medications Prior to Admission medications   Medication Sig Start Date End Date Taking? Authorizing Provider  benzonatate (TESSALON) 100 MG capsule Take 1 capsule (100 mg total) by mouth every 8 (eight) hours. 11/24/20   Henderly, Britni A, PA-C  fluticasone (FLONASE) 50 MCG/ACT nasal spray Place 2 sprays into both nostrils daily. 11/24/20   Henderly, Britni A, PA-C  metaxalone (SKELAXIN) 800 MG tablet Take 1 tablet (800 mg total) by mouth 3 (three) times daily. 04/04/21   Denyse Fillion, MD  metroNIDAZOLE (FLAGYL) 500 MG tablet Take 1 tablet (500 mg total) by mouth 2 (two) times daily. 09/10/21   Achille Rich, PA-C  naproxen (NAPROSYN) 375 MG tablet Take 1 tablet (375 mg total) by mouth 2 (two) times daily with a meal. 04/04/21   Lena Fieldhouse, MD  UNABLE TO FIND Med Name: ZAF birth control    [provider]  valACYclovir (VALTREX) 1000 MG tablet Take 1 tablet (1,000 mg total) by mouth 2 (two) times daily. 09/10/21   Achille Rich, PA-C  cetirizine (ZYRTEC) 10 MG tablet Take 1 tablet (10 mg total) by mouth daily. 11/08/17 11/24/20  Caccavale, Sophia,  PA-C      Allergies    Patient has no known allergies.    Review of Systems   Review of Systems  Constitutional:  Negative for fever.  HENT:  Negative for facial swelling.   Eyes:  Negative for redness.  Respiratory:  Negative for wheezing and stridor.   Gastrointestinal:  Negative for vomiting.  Musculoskeletal:  Positive for myalgias. Negative for back pain and neck pain.  All other systems reviewed and are negative.   Physical Exam Updated Vital Signs BP 111/75 (BP Location: Left Arm)   Pulse 71   Temp 98.8 F (37.1 C) (Oral)   Resp 16   Ht 5\' 3"  (1.6 m)   Wt 75.6 kg   LMP 04/23/2022 (Approximate)   SpO2 100%   BMI 29.51 kg/m  Physical Exam Vitals and nursing note reviewed.  Constitutional:      General: She is not in acute distress.    Appearance: Normal appearance. She is well-developed.  HENT:     Head: Normocephalic and atraumatic.     Nose: Nose normal.  Eyes:     Pupils: Pupils are equal, round, and reactive to light.  Cardiovascular:     Rate and Rhythm: Normal rate and regular rhythm.     Pulses: Normal pulses.     Heart sounds: Normal heart sounds.  Pulmonary:  Effort: No respiratory distress.     Breath sounds: Normal breath sounds.  Abdominal:     General: Bowel sounds are normal. There is no distension.     Palpations: Abdomen is soft.     Tenderness: There is no abdominal tenderness. There is no guarding or rebound.  Genitourinary:    Vagina: No vaginal discharge.  Musculoskeletal:        General: Normal range of motion.     Cervical back: Normal range of motion and neck supple.     Right hip: Normal.     Right upper leg: Normal. No swelling, edema, deformity, lacerations, tenderness or bony tenderness.     Right knee: Normal. No LCL laxity, MCL laxity, ACL laxity or PCL laxity. Normal patellar mobility. Normal pulse.     Right lower leg: Normal.       Legs:     Comments: No cords, negative Homan's sign of the RLE  Skin:    General:  Skin is warm and dry.     Capillary Refill: Capillary refill takes less than 2 seconds.     Findings: No erythema or rash.  Neurological:     General: No focal deficit present.     Mental Status: She is alert and oriented to person, place, and time.  Psychiatric:        Mood and Affect: Mood normal.        Behavior: Behavior normal.     ED Results / Procedures / Treatments   Labs (all labs ordered are listed, but only abnormal results are displayed) Labs Reviewed - No data to display  EKG None  Radiology No results found.  Procedures Procedures    Medications Ordered in ED Medications  acetaminophen (TYLENOL) tablet 1,000 mg (has no administration in time range)  ibuprofen (ADVIL) tablet 800 mg (has no administration in time range)    ED Course/ Medical Decision Making/ A&P                           Medical Decision Making Thigh pain x 2 weeks   Amount and/or Complexity of Data Reviewed External Data Reviewed: notes.    Details: previous notes reviewed  Risk OTC drugs. Prescription drug management. Risk Details: No cords, negative Homan's sign.  The location of patient's pain makes quadriceps strain the likely causes.  I do not believe this is a DVT as the pain is anterior and slightly lateral.  No swelling.  No redness no warmth no fluctuance.  Nothing to suggest infection.  Alternate tylenol and motrin and ice the area.  Gentle stretching.      Final Clinical Impression(s) / ED Diagnoses Final diagnoses:  None   Return for intractable cough, coughing up blood, fevers > 100.4 unrelieved by medication, shortness of breath, intractable vomiting, chest pain, shortness of breath, weakness, numbness, changes in speech, facial asymmetry, abdominal pain, passing out, Inability to tolerate liquids or food, cough, altered mental status or any concerns. No signs of systemic illness or infection. The patient is nontoxic-appearing on exam and vital signs are within normal  limits.  I have reviewed the triage vital signs and the nursing notes. Pertinent labs & imaging results that were available during my care of the patient were reviewed by me and considered in my medical decision making (see chart for details). After history, exam, and medical workup I feel the patient has been appropriately medically screened and is safe for discharge home.  Pertinent diagnoses were discussed with the patient. Patient was given return precautions.  Rx / DC Orders ED Discharge Orders     None         Tron Flythe, MD 05/24/22 0201

## 2022-06-01 ENCOUNTER — Other Ambulatory Visit (HOSPITAL_BASED_OUTPATIENT_CLINIC_OR_DEPARTMENT_OTHER): Payer: Self-pay

## 2022-10-01 IMAGING — DX DG CHEST 2V
2 series · 2 of 2 positions shown · non-contrast
Comparison: AP chest 04/03/2021

CLINICAL DATA: Shortness of breath.

EXAM:
CHEST - 2 VIEW

[chest pa]
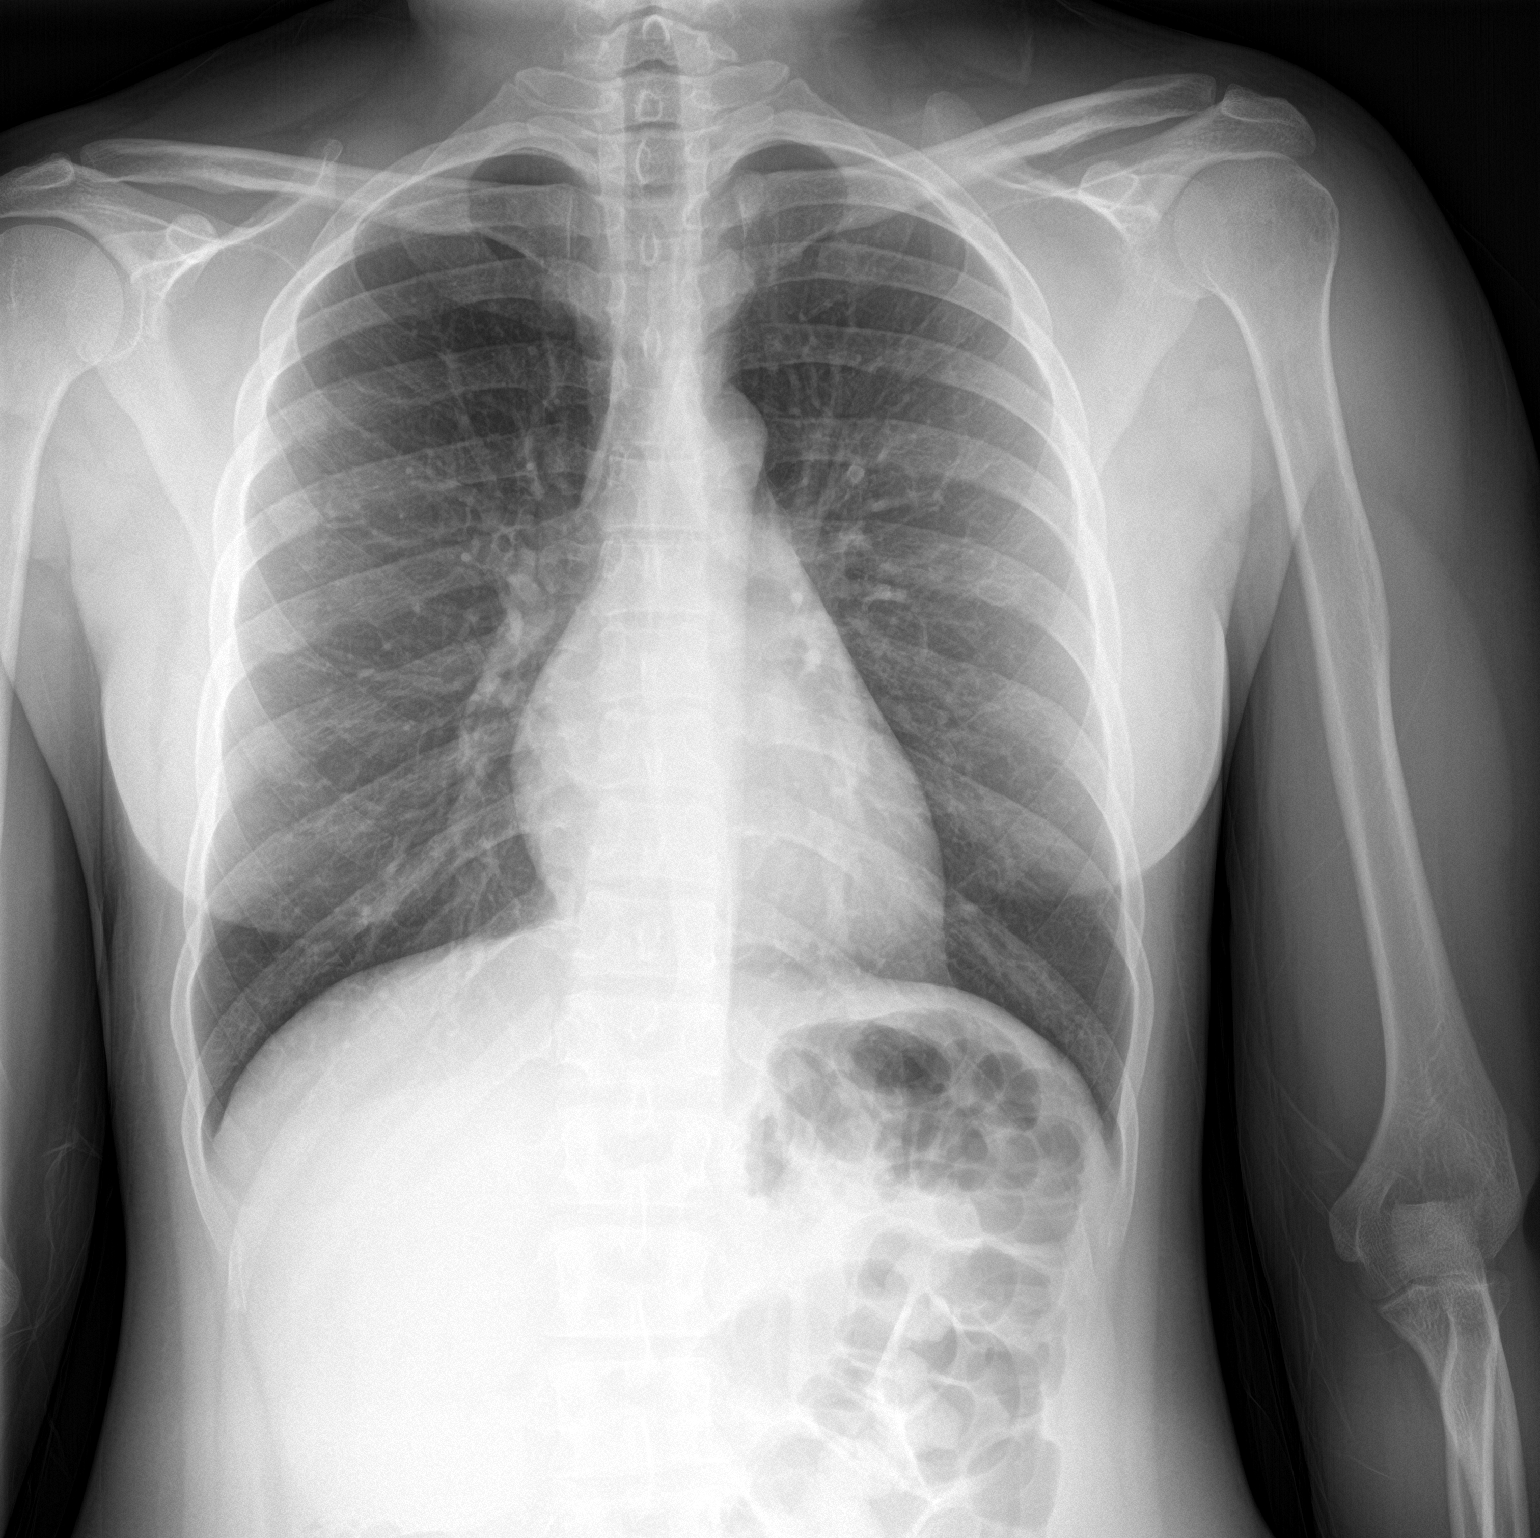

[chest lat]
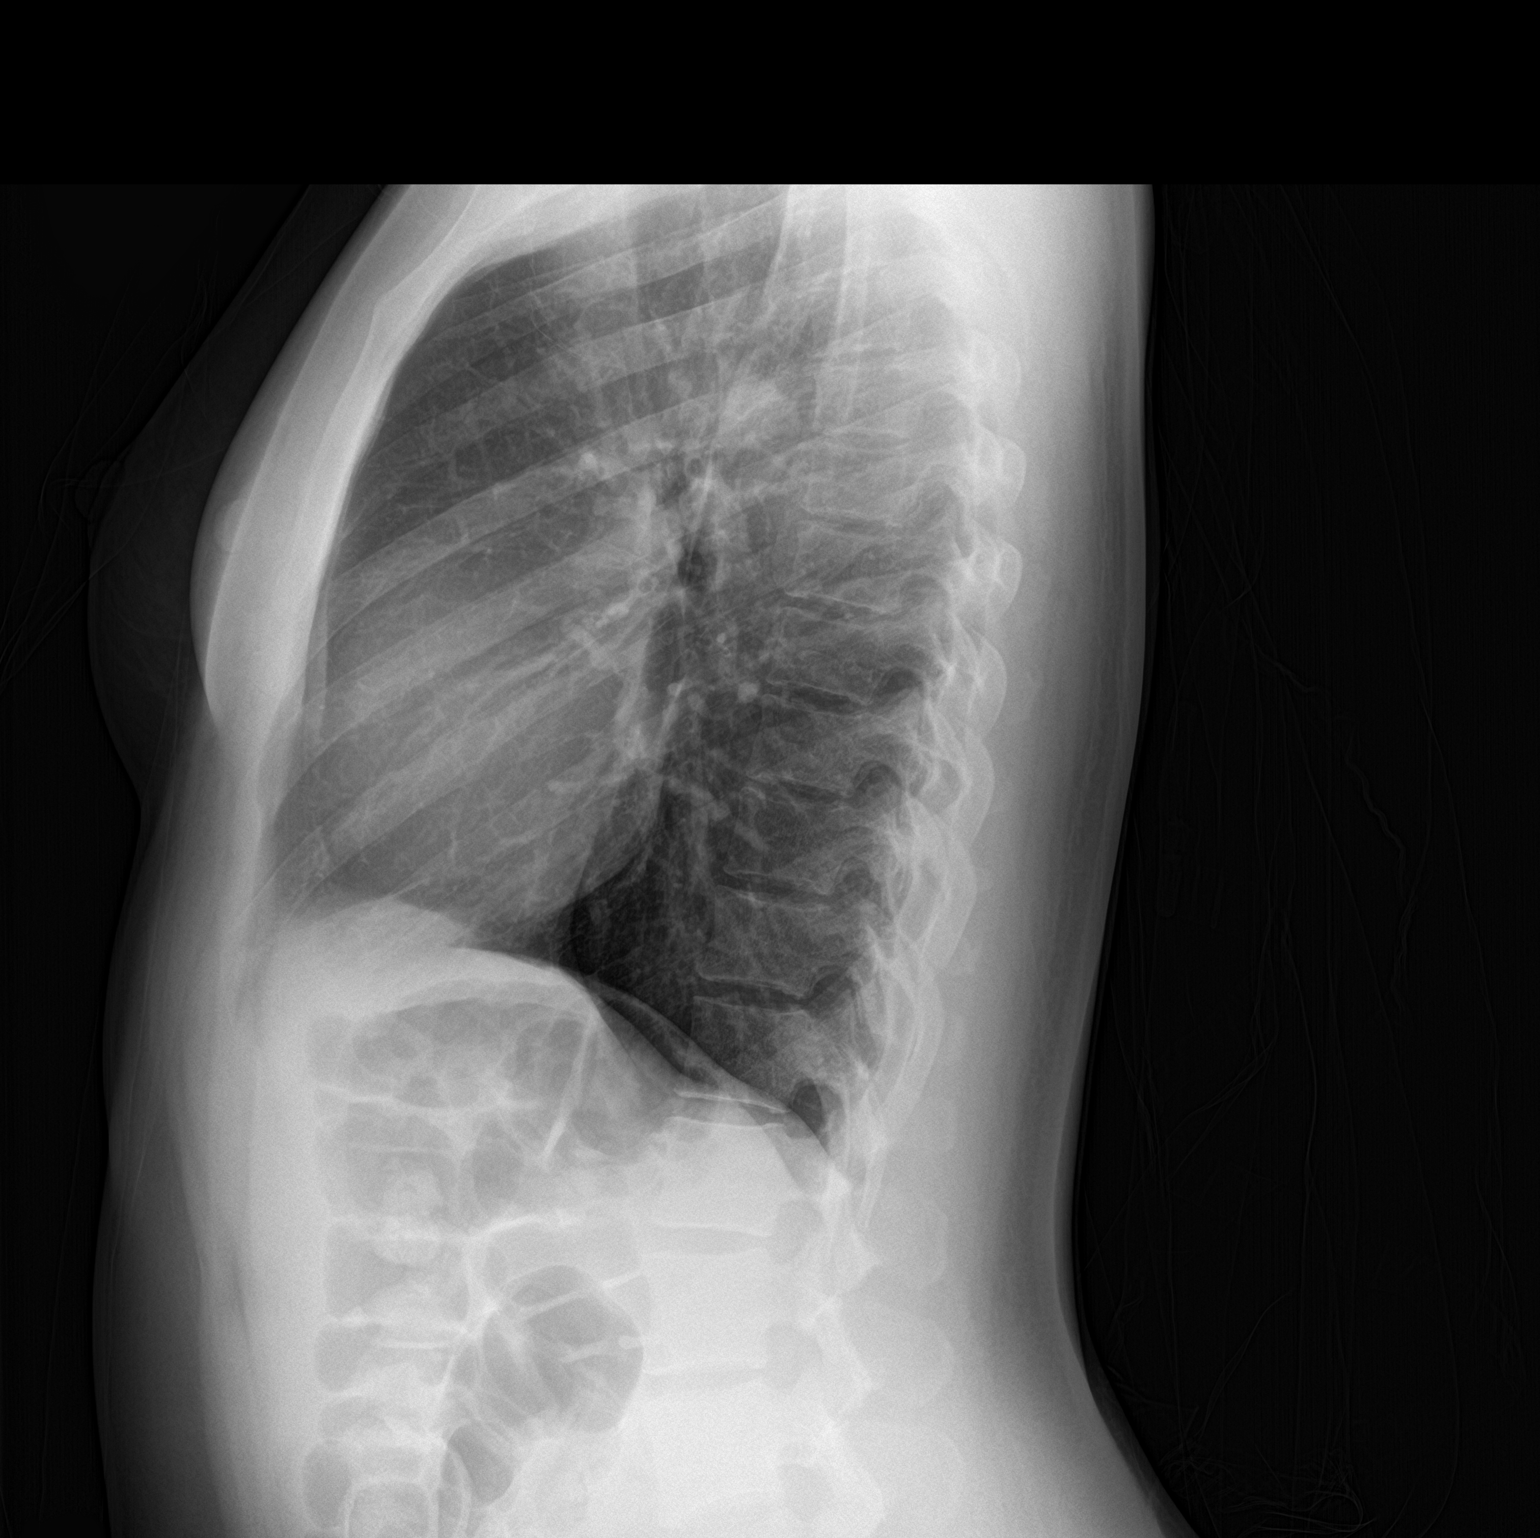

[2 of 2 positions shown; findings below may reference images not displayed]

FINDINGS: Cardiac silhouette and mediastinal contours are within normal
limits. The lungs are clear. No pleural effusion or pneumothorax. No
acute skeletal abnormality.
IMPRESSION: No active cardiopulmonary disease.

## 2023-11-11 ENCOUNTER — Emergency Department (HOSPITAL_BASED_OUTPATIENT_CLINIC_OR_DEPARTMENT_OTHER)
Admission: EM | Admit: 2023-11-11 | Discharge: 2023-11-11 | Disposition: A | Discharge status: Home / Self Care | Payer: BC Managed Care – PPO | Attending: Emergency Medicine | Admitting: Emergency Medicine

## 2023-11-11 ENCOUNTER — Other Ambulatory Visit: Payer: Self-pay

## 2023-11-11 ENCOUNTER — Encounter (HOSPITAL_BASED_OUTPATIENT_CLINIC_OR_DEPARTMENT_OTHER): Payer: Self-pay | Admitting: Emergency Medicine

## 2023-11-11 DIAGNOSIS — J069 Acute upper respiratory infection, unspecified: Secondary | ICD-10-CM | POA: Diagnosis not present

## 2023-11-11 DIAGNOSIS — R059 Cough, unspecified: Secondary | ICD-10-CM | POA: Diagnosis present

## 2023-11-11 MED ORDER — ONDANSETRON 4 MG PO TBDP: ORAL_TABLET | ORAL | 0 refills | Status: DC

## 2023-11-11 MED ORDER — BENZONATATE 100 MG PO CAPS: 100.0000 mg | ORAL_CAPSULE | Freq: Three times a day (TID) | ORAL | 0 refills | Status: DC

## 2023-11-11 MED ORDER — AZITHROMYCIN 250 MG PO TABS: 250.0000 mg | ORAL_TABLET | Freq: Every day | ORAL | 0 refills | Status: DC

## 2023-11-11 NOTE — ED Provider Notes (Signed)
[place] EMERGENCY DEPARTMENT AT [facility] Provider Note   CSN: [id number] Arrival date & time: 11/11/23  0444     History  Chief Complaint  Patient presents with   Cough    Emily Bennett is a 32 y.o. female.  32 yo F with a chief complaints of cough congestion this has been going on for a couple weeks.  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  She denies any difficulty breathing denies any ear pain denies sinus tenderness.   Cough      Home Medications Prior to Admission medications   Medication Sig Start Date End Date Taking? Authorizing Provider  azithromycin (ZITHROMAX) 250 MG tablet Take 1 tablet (250 mg total) by mouth daily. Take first 2 tablets together, then 1 every day until finished. 11/11/23  Yes Melene Plan, DO  benzonatate (TESSALON) 100 MG capsule Take 1 capsule (100 mg total) by mouth every 8 (eight) hours. 11/11/23  Yes Melene Plan, DO  ondansetron (ZOFRAN-ODT) 4 MG disintegrating tablet 4mg  ODT q4 hours prn nausea/vomit 11/11/23  Yes Adela Lank, Major [place], DO  fluticasone (FLONASE) 50 MCG/ACT nasal spray Place 2 sprays into both nostrils daily. 11/24/20   Henderly, Britni A, PA-C  ibuprofen (ADVIL) 400 MG tablet Take 1 tablet (400 mg total) by mouth every 6 (six) hours as needed. 05/24/22   Palumbo, April, MD  metaxalone (SKELAXIN) 800 MG tablet Take 1 tablet (800 mg total) by mouth 3 (three) times daily. 04/04/21   Palumbo, April, MD  metroNIDAZOLE (FLAGYL) 500 MG tablet Take 1 tablet (500 mg total) by mouth 2 (two) times daily. 09/10/21   Achille Rich, PA-C  naproxen (NAPROSYN) 375 MG tablet Take 1 tablet (375 mg total) by mouth 2 (two) times daily with a meal. 04/04/21   Palumbo, April, MD  UNABLE TO FIND Med Name: ZAF birth control    [provider]  valACYclovir (VALTREX) 1000 MG tablet Take 1 tablet (1,000 mg total) by mouth 2 (two) times daily. 09/10/21   Achille Rich, PA-C  cetirizine (ZYRTEC) 10 MG  tablet Take 1 tablet (10 mg total) by mouth daily. 11/08/17 11/24/20  Caccavale, Sophia, PA-C      Allergies    Patient has no known allergies.    Review of Systems   Review of Systems  Respiratory:  Positive for cough.     Physical Exam Updated Vital Signs BP 124/69   Pulse 89   Temp 98.6 F (37 C) (Oral)   Resp 18   Ht 5\' 3"  (1.6 m)   Wt 68 kg   LMP 10/12/2023 (Exact Date)   SpO2 100%   BMI 26.57 kg/m  Physical Exam Vitals and nursing note reviewed.  Constitutional:      General: She is not in acute distress.    Appearance: She is well-developed. She is not diaphoretic.  HENT:     Head: Normocephalic and atraumatic.     Comments: Swollen turbinates, posterior nasal drip, no noted sinus ttp, tm normal bilaterally.   Eyes:     Pupils: Pupils are equal, round, and reactive to light.  Cardiovascular:     Rate and Rhythm: Normal rate and regular rhythm.     Heart sounds: No murmur heard.    No friction rub. No gallop.  Pulmonary:     Effort: Pulmonary effort is normal.     Breath sounds: No wheezing or rales.  Abdominal:     General: There is  no distension.     Palpations: Abdomen is soft.     Tenderness: There is no abdominal tenderness.  Musculoskeletal:        General: No tenderness.     Cervical back: Normal range of motion and neck supple.  Skin:    General: Skin is warm and dry.  Neurological:     Mental Status: She is alert and oriented to person, place, and time.  Psychiatric:        Behavior: Behavior normal.     ED Results / Procedures / Treatments   Labs (all labs ordered are listed, but only abnormal results are displayed) Labs Reviewed - No data to display  EKG None  Radiology No results found.  Procedures Procedures    Medications Ordered in ED Medications - No data to display  ED Course/ Medical Decision Making/ A&P                                 Medical Decision Making Risk Prescription drug management.   32 yo F with  a chief complaint of cough and congestion going on for a couple weeks.  She is well-appearing nontoxic.  Is clear lung sounds for me.  She is worried because is not getting any better.  I discussed limitations of antibiotics in this scenario however after some discussion we will trial azithromycin.  PCP follow-up.  5:05 AM:  I have discussed the diagnosis/risks/treatment options with the patient.  Evaluation and diagnostic testing in the emergency department does not suggest an emergent condition requiring admission or immediate intervention beyond what has been performed at this time.  They will follow up with PCP. We also discussed returning to the ED immediately if new or worsening sx occur. We discussed the sx which are most concerning (e.g., sudden worsening pain, fever, inability to tolerate by mouth) that necessitate immediate return. Medications administered to the patient during their visit and any new prescriptions provided to the patient are listed below.  Medications given during this visit Medications - No data to display   The patient appears reasonably screen and/or stabilized for discharge and I doubt any other medical condition or other [facility] requiring further screening, evaluation, or treatment in the ED at this time prior to discharge.          Final Clinical Impression(s) / ED Diagnoses Final diagnoses:  Viral URI with cough    Rx / DC Orders ED Discharge Orders          Ordered    azithromycin (ZITHROMAX) 250 MG tablet  Daily        11/11/23 0504    benzonatate (TESSALON) 100 MG capsule  Every 8 hours        11/11/23 0504    ondansetron (ZOFRAN-ODT) 4 MG disintegrating tablet        11/11/23 0504              Melene Plan, DO 11/11/23 0505

## 2023-11-11 NOTE — Discharge Instructions (Signed)
Take tylenol 2 pills 4 times a day and motrin 4 pills 3 times a day.  Drink plenty of fluids.  Return for worsening shortness of breath, headache, confusion. Follow up with your family doctor.   

## 2023-11-11 NOTE — ED Triage Notes (Signed)
Pt reports cough x 2 weeks. Reports she was seen at [facility] last week & was given cough medicine but reports it hasn't helped.

## 2024-08-27 ENCOUNTER — Other Ambulatory Visit: Payer: Self-pay

## 2024-08-27 ENCOUNTER — Encounter (HOSPITAL_BASED_OUTPATIENT_CLINIC_OR_DEPARTMENT_OTHER): Payer: Self-pay

## 2024-08-27 DIAGNOSIS — N3000 Acute cystitis without hematuria: Secondary | ICD-10-CM | POA: Insufficient documentation

## 2024-08-27 DIAGNOSIS — Z3A01 Less than 8 weeks gestation of pregnancy: Secondary | ICD-10-CM | POA: Diagnosis not present

## 2024-08-27 DIAGNOSIS — O219 Vomiting of pregnancy, unspecified: Secondary | ICD-10-CM | POA: Insufficient documentation

## 2024-08-27 LAB — BASIC METABOLIC PANEL WITH GFR
Anion gap: 12 (ref 5–15)
BUN: 9 mg/dL (ref 6–20)
CO2: 22 mmol/L (ref 22–32)
Calcium: 8.8 mg/dL — ABNORMAL LOW (ref 8.9–10.3)
Chloride: 102 mmol/L (ref 98–111)
Creatinine, Ser: 0.72 mg/dL (ref 0.44–1.00)
GFR, Estimated: >60 mL/min (ref >60)
Glucose, Bld: 107 mg/dL — ABNORMAL HIGH (ref 70–99)
Potassium: 3.8 mmol/L (ref 3.5–5.1)
Sodium: 135 mmol/L (ref 135–145)

## 2024-08-27 LAB — URINALYSIS, ROUTINE W REFLEX MICROSCOPIC
Bilirubin Urine: NEGATIVE
Glucose, UA: NEGATIVE mg/dL
Ketones, ur: NEGATIVE mg/dL
Leukocytes,Ua: NEGATIVE
Nitrite: POSITIVE — AB
Protein, ur: NEGATIVE mg/dL
Specific Gravity, Urine: 1.02 (ref 1.005–1.030)
pH: 5.5 (ref 5.0–8.0)

## 2024-08-27 LAB — URINALYSIS, MICROSCOPIC (REFLEX)

## 2024-08-27 LAB — CBC WITH DIFFERENTIAL/PLATELET
Abs Immature Granulocytes: 0.02 K/uL (ref 0.00–0.07)
Basophils Absolute: 0 K/uL (ref 0.0–0.1)
Basophils Relative: 0 %
Eosinophils Absolute: 0.1 K/uL (ref 0.0–0.5)
Eosinophils Relative: 1 %
HCT: 35.7 % — ABNORMAL LOW (ref 36.0–46.0)
Hemoglobin: 12 g/dL (ref 12.0–15.0)
Immature Granulocytes: 0 %
Lymphocytes Relative: 20 %
Lymphs Abs: 1.8 K/uL (ref 0.7–4.0)
MCH: 27.4 pg (ref 26.0–34.0)
MCHC: 33.6 g/dL (ref 30.0–36.0)
MCV: 81.5 fL (ref 80.0–100.0)
Monocytes Absolute: 0.9 K/uL (ref 0.1–1.0)
Monocytes Relative: 10 %
Neutro Abs: 5.9 K/uL (ref 1.7–7.7)
Neutrophils Relative %: 69 %
Platelets: 256 K/uL (ref 150–400)
RBC: 4.38 MIL/uL (ref 3.87–5.11)
RDW: 13.3 % (ref 11.5–15.5)
WBC: 8.8 K/uL (ref 4.0–10.5)
nRBC: 0 % (ref 0.0–0.2)

## 2024-08-27 LAB — HCG, QUANTITATIVE, PREGNANCY: hCG, Beta Chain, Quant, S: 53879 m[IU]/mL — ABNORMAL HIGH (ref <5)

## 2024-08-27 NOTE — ED Triage Notes (Signed)
 Pt states she is [redacted] wks pregnant and has vomiting all day. Pain in her lower stomach

## 2024-08-28 ENCOUNTER — Encounter (HOSPITAL_COMMUNITY): Payer: Self-pay | Admitting: *Deleted

## 2024-08-28 ENCOUNTER — Inpatient Hospital Stay (HOSPITAL_BASED_OUTPATIENT_CLINIC_OR_DEPARTMENT_OTHER): Admission: EM | Admit: 2024-08-28 | Discharge: 2024-08-28 | Disposition: A | Discharge status: Home / Self Care

## 2024-08-28 ENCOUNTER — Inpatient Hospital Stay (HOSPITAL_COMMUNITY)

## 2024-08-28 DIAGNOSIS — O219 Vomiting of pregnancy, unspecified: Secondary | ICD-10-CM

## 2024-08-28 DIAGNOSIS — Z3A01 Less than 8 weeks gestation of pregnancy: Secondary | ICD-10-CM | POA: Diagnosis not present

## 2024-08-28 DIAGNOSIS — N3000 Acute cystitis without hematuria: Secondary | ICD-10-CM

## 2024-08-28 DIAGNOSIS — Z3491 Encounter for supervision of normal pregnancy, unspecified, first trimester: Secondary | ICD-10-CM

## 2024-08-28 MED ORDER — SODIUM CHLORIDE 0.9 % IV SOLN
1.0000 g | Freq: Once | INTRAVENOUS | Status: AC
  Administered 2024-08-28: 1 g via INTRAVENOUS
  Filled 2024-08-28: qty 10

## 2024-08-28 MED ORDER — METOCLOPRAMIDE HCL 10 MG PO TABS: 10.0000 mg | ORAL_TABLET | Freq: Three times a day (TID) | ORAL | 0 refills | Status: AC | PRN

## 2024-08-28 MED ORDER — PROMETHAZINE HCL 25 MG/ML IJ SOLN
INTRAMUSCULAR | Status: AC
  Filled 2024-08-28: qty 1

## 2024-08-28 MED ORDER — LACTATED RINGERS IV BOLUS
1000.0000 mL | Freq: Once | INTRAVENOUS | Status: AC
  Administered 2024-08-28: 1000 mL via INTRAVENOUS

## 2024-08-28 MED ORDER — SODIUM CHLORIDE 0.9 % IV SOLN
12.5000 mg | Freq: Once | INTRAVENOUS | Status: AC
  Administered 2024-08-28: 12.5 mg via INTRAVENOUS
  Filled 2024-08-28: qty 0.5

## 2024-08-28 MED ORDER — CEPHALEXIN 500 MG PO CAPS: 500.0000 mg | ORAL_CAPSULE | Freq: Three times a day (TID) | ORAL | 0 refills | Status: DC

## 2024-08-28 NOTE — Discharge Instructions (Addendum)
 Emily Bennett, Congratulations on your pregnancy! Your ultrasound showed a health pregnancy in the uterus with a heartbeat. Please take a prenatal vitamin daily. Sometimes gummy vitamins are easier than pill when you are nauseous. A prescription for Reglan was sent to your pharmacy for nausea/vomiting. A prescription for urinary tract infection was sent to your pharmacy.   Return to the ED with worsening abdominal pain, intractable nausea/vomiting, vaginal bleeding, dizziness, lightheadedness, chest pain, shortness of breath or other concerns. Follow up at your previously scheduled prenatal appointment.  Take care, Dr. Trudy

## 2024-08-28 NOTE — MAU Note (Signed)
 Emily Bennett is a 33 y.o. at [redacted]w[redacted]d here in MAU reporting coming over from [facility] for vag u/s. She received IVFs and nausea med there and feels better. NO VB. Has had some cramping but no pain currently.   LMP: 07/07/24 Onset of complaint: na Pain score: 0 Vitals:   08/28/24 0404 08/28/24 0407  BP:  120/64  Pulse: 87   Resp: 17   Temp: 99.5 F (37.5 C)   SpO2: 99%      FHT: na  Lab orders placed from triage: none

## 2024-08-28 NOTE — MAU Note (Cosign Needed)
 Maternal Assessment Unit Provider Note  Subjective: [name] is a 33 y.o. G2P0 pregnant female at [redacted]w[redacted]d who transferred from [facility] ED to MAU today for US .  She originally presented to [facility] ER for nausea and vomiting in early pregnancy.  She did not yet had an ultrasound this pregnancy.  She diagnosed with a UTI, given fluids and promethazine.  She also had symptoms of abdominal/pelvic cramping without spotting or bleeding. Bedside transabdominal ultrasound had demonstrated an intrauterine gestational sac and she was transferred to the MAU for transvaginal ultrasound.  She is feeling much better. Nausea and vomiting improved. She was prescribed cephalexin  for UTI at the [facility] ED, but nothing for nausea and vomiting pregnancy which she has been dealing with prior to today.  Her first prenatal appointment scheduled on 10/30  Pertinent items noted in HPI and remainder of comprehensive ROS otherwise negative.   Objective: BP 120/64   Pulse 87   Temp 99.5 F (37.5 C)   Resp 17   Ht 5' 3 (1.6 m)   Wt 81.6 kg   LMP 10/12/2023 (Exact Date)   SpO2 99%   BMI 31.89 kg/m  Physical Exam Vitals reviewed.  Constitutional:      General: She is not in acute distress.    Appearance: She is well-developed. She is not diaphoretic.  HENT:     Head: Normocephalic and atraumatic.  Eyes:     General: No scleral icterus. Pulmonary:     Effort: Pulmonary effort is normal. No respiratory distress.  Skin:    General: Skin is warm and dry.  Neurological:     General: No focal deficit present.     Mental Status: She is alert.    Results for orders placed or performed during the hospital encounter of 08/28/24 (from the past 24 hours)  CBC with Differential     Status: Abnormal   Collection Time: 08/27/24 10:05 PM  Result Value Ref Range   WBC 8.8 4.0 - 10.5 K/uL   RBC 4.38 3.87 - 5.11 MIL/uL   Hemoglobin 12.0 12.0 - 15.0 g/dL   HCT 64.2 (L) 63.9 - 53.9 %   MCV 81.5 80.0 - 100.0 fL    MCH 27.4 26.0 - 34.0 pg   MCHC 33.6 30.0 - 36.0 g/dL   RDW 86.6 88.4 - 84.4 %   Platelets 256 150 - 400 K/uL   nRBC 0.0 0.0 - 0.2 %   Neutrophils Relative % 69 %   Neutro Abs 5.9 1.7 - 7.7 K/uL   Lymphocytes Relative 20 %   Lymphs Abs 1.8 0.7 - 4.0 K/uL   Monocytes Relative 10 %   Monocytes Absolute 0.9 0.1 - 1.0 K/uL   Eosinophils Relative 1 %   Eosinophils Absolute 0.1 0.0 - 0.5 K/uL   Basophils Relative 0 %   Basophils Absolute 0.0 0.0 - 0.1 K/uL   Immature Granulocytes 0 %   Abs Immature Granulocytes 0.02 0.00 - 0.07 K/uL  Basic metabolic panel     Status: Abnormal   Collection Time: 08/27/24 10:05 PM  Result Value Ref Range   Sodium 135 135 - 145 mmol/L   Potassium 3.8 3.5 - 5.1 mmol/L   Chloride 102 98 - 111 mmol/L   CO2 22 22 - 32 mmol/L   Glucose, Bld 107 (H) 70 - 99 mg/dL   BUN 9 6 - 20 mg/dL   Creatinine, Ser 9.27 0.44 - 1.00 mg/dL   Calcium 8.8 (L) 8.9 - 10.3 mg/dL  GFR, Estimated >60 >60 mL/min   Anion gap 12 5 - 15  Urinalysis, Routine w reflex microscopic -Urine, Clean Catch     Status: Abnormal   Collection Time: 08/27/24 10:05 PM  Result Value Ref Range   Color, Urine YELLOW YELLOW   APPearance HAZY (A) CLEAR   Specific Gravity, Urine 1.020 1.005 - 1.030   pH 5.5 5.0 - 8.0   Glucose, UA NEGATIVE NEGATIVE mg/dL   Hgb urine dipstick TRACE (A) NEGATIVE   Bilirubin Urine NEGATIVE NEGATIVE   Ketones, ur NEGATIVE NEGATIVE mg/dL   Protein, ur NEGATIVE NEGATIVE mg/dL   Nitrite POSITIVE (A) NEGATIVE   Leukocytes,Ua NEGATIVE NEGATIVE  hCG, quantitative, pregnancy     Status: Abnormal   Collection Time: 08/27/24 10:05 PM  Result Value Ref Range   hCG, Beta Chain, Quant, S 53,879 (H) <5 mIU/mL  Urinalysis, Microscopic (reflex)     Status: Abnormal   Collection Time: 08/27/24 10:05 PM  Result Value Ref Range   RBC / HPF 0-5 0 - 5 RBC/hpf   WBC, UA 0-5 0 - 5 WBC/hpf   Bacteria, UA MANY (A) NONE SEEN   Squamous Epithelial / HPF 0-5 0 - 5 /HPF     MDM: Moderate risk  MAU Course:  Time: 0515  Reviewed ultrasound results with patient which demonstrates a viable IUP with a heart beat.  Patient feeling well now.  No current nausea vomiting.  Prescribed Reglan and discussed strategies for addressing morning sickness.  Already prescribed Keflex  for UTI  Discussed the importance of taking a prenatal.  Questions were answered to the satisfaction of the patient and/or family prior to discharge.   Assessment    ICD-10-CM   1. Nausea and vomiting during pregnancy  O21.9     2. Acute cystitis without hematuria  N30.00     3. Normal pregnancy in first trimester  Z34.91        Plan -Reglan prescribed for nausea and vomiting in pregnancy - Cephalexin  prescribed for UTI - Please start taking prenatal - Reassurance given regarding results of ultrasound demonstrating healthy intrauterine pregnancy. Discharge from MAU in stable condition with strict return precautions  Follow up at prenatal appointment as scheduled for ongoing prenatal care  Allergies as of 08/28/2024   No Known Allergies      Medication List     STOP taking these medications    azithromycin  250 MG tablet Commonly known as: ZITHROMAX    benzonatate  100 MG capsule Commonly known as: TESSALON    fluticasone  50 MCG/ACT nasal spray Commonly known as: FLONASE    ibuprofen  400 MG tablet Commonly known as: ADVIL    metaxalone  800 MG tablet Commonly known as: SKELAXIN    metroNIDAZOLE  500 MG tablet Commonly known as: FLAGYL    naproxen  375 MG tablet Commonly known as: NAPROSYN    ondansetron  4 MG disintegrating tablet Commonly known as: ZOFRAN -ODT   UNABLE TO FIND   valACYclovir  1000 MG tablet Commonly known as: VALTREX        TAKE these medications    cephALEXin  500 MG capsule Commonly known as: KEFLEX  Take 1 capsule (500 mg total) by mouth 3 (three) times daily.   metoCLOPramide 10 MG tablet Commonly known as: Reglan Take 1 tablet (10  mg total) by mouth every 8 (eight) hours as needed for nausea or vomiting.        Trudy Leeroy NOVAK, MD 08/28/2024 5:14 AM

## 2024-08-28 NOTE — Progress Notes (Signed)
 Written and verbal d/c instructions given and pt voiced understanding.

## 2024-08-28 NOTE — ED Provider Notes (Signed)
 [facility] Provider Note   CSN: [id number] Arrival date & time: 08/27/24  2157     Patient presents with: Emesis   Emily Bennett is a 33 y.o. female.   Patient presents with lower abdominal pain, nausea and vomiting.  Believes she is about [redacted] weeks pregnant.  Last menstrual cycle was beginning of August.  No bleeding or discharge.  Has not had an ultrasound with this pregnancy.  Having crampy lower abdominal pain for the past several weeks with nausea and vomiting x 3 today unable to keep anything down.  No fever.  No vaginal bleeding or discharge.  No chest pain or shortness of breath.  No back pain.  This is her second pregnancy.  Did have elective abortion previously.  The history is provided by the patient.  Emesis Associated symptoms: abdominal pain   Associated symptoms: no arthralgias, no cough, no fever, no headaches and no myalgias        Prior to Admission medications   Medication Sig Start Date End Date Taking? Authorizing Provider  azithromycin  (ZITHROMAX ) 250 MG tablet Take 1 tablet (250 mg total) by mouth daily. Take first 2 tablets together, then 1 every day until finished. 11/11/23   Emil Share, DO  benzonatate  (TESSALON ) 100 MG capsule Take 1 capsule (100 mg total) by mouth every 8 (eight) hours. 11/11/23   Emil Share, DO  fluticasone  (FLONASE ) 50 MCG/ACT nasal spray Place 2 sprays into both nostrils daily. 11/24/20   Henderly, Britni A, PA-C  ibuprofen  (ADVIL ) 400 MG tablet Take 1 tablet (400 mg total) by mouth every 6 (six) hours as needed. 05/24/22   Palumbo, April, MD  metaxalone  (SKELAXIN ) 800 MG tablet Take 1 tablet (800 mg total) by mouth 3 (three) times daily. 04/04/21   Palumbo, April, MD  metroNIDAZOLE  (FLAGYL ) 500 MG tablet Take 1 tablet (500 mg total) by mouth 2 (two) times daily. 09/10/21   Bernis Ernst, PA-C  naproxen  (NAPROSYN ) 375 MG tablet Take 1 tablet (375 mg total) by mouth 2 (two) times daily with a  meal. 04/04/21   Palumbo, April, MD  ondansetron  (ZOFRAN -ODT) 4 MG disintegrating tablet 4mg  ODT q4 hours prn nausea/vomit 11/11/23   Floyd, Dan, DO  UNABLE TO FIND Med Name: ZAF birth control    [provider]  valACYclovir  (VALTREX ) 1000 MG tablet Take 1 tablet (1,000 mg total) by mouth 2 (two) times daily. 09/10/21   Bernis Ernst, PA-C  cetirizine  (ZYRTEC ) 10 MG tablet Take 1 tablet (10 mg total) by mouth daily. 11/08/17 11/24/20  Caccavale, Sophia, PA-C    Allergies: Patient has no known allergies.    Review of Systems  Constitutional:  Positive for activity change and appetite change. Negative for fever.  HENT:  Negative for congestion and rhinorrhea.   Respiratory:  Negative for cough, chest tightness and shortness of breath.   Cardiovascular:  Negative for chest pain.  Gastrointestinal:  Positive for abdominal pain, nausea and vomiting.  Genitourinary:  Negative for dysuria.  Musculoskeletal:  Negative for arthralgias and myalgias.  Skin:  Negative for rash.  Neurological:  Negative for dizziness, weakness and headaches.   all other systems are negative except as noted in the HPI and PMH.    Updated Vital Signs BP 105/71 (BP Location: Left Arm)   Pulse 78   Temp 98 F (36.7 C) (Oral)   Resp 18   Ht 5' 3 (1.6 m)   Wt 80.3 kg   LMP 10/12/2023 (  Exact Date)   SpO2 100%   BMI 31.35 kg/m   Physical Exam Vitals and nursing note reviewed.  Constitutional:      General: She is not in acute distress.    Appearance: She is well-developed.  HENT:     Head: Normocephalic and atraumatic.     Mouth/Throat:     Pharynx: No oropharyngeal exudate.  Eyes:     Conjunctiva/sclera: Conjunctivae normal.     Pupils: Pupils are equal, round, and reactive to light.  Neck:     Comments: No meningismus. Cardiovascular:     Rate and Rhythm: Normal rate and regular rhythm.     Heart sounds: Normal heart sounds. No murmur heard. Pulmonary:     Effort: Pulmonary effort is  normal. No respiratory distress.     Breath sounds: Normal breath sounds.  Abdominal:     Palpations: Abdomen is soft.     Tenderness: There is abdominal tenderness. There is no guarding or rebound.  Musculoskeletal:        General: No tenderness. Normal range of motion.     Cervical back: Normal range of motion and neck supple.  Skin:    General: Skin is warm.  Neurological:     Mental Status: She is alert and oriented to person, place, and time.     Cranial Nerves: No cranial nerve deficit.     Motor: No abnormal muscle tone.     Coordination: Coordination normal.     Comments:  5/5 strength throughout. CN 2-12 intact.Equal grip strength.   Psychiatric:        Behavior: Behavior normal.     (all labs ordered are listed, but only abnormal results are displayed) Labs Reviewed  CBC WITH DIFFERENTIAL/PLATELET - Abnormal; Notable for the following components:      Result Value   HCT 35.7 (*)    All other components within normal limits  BASIC METABOLIC PANEL WITH GFR - Abnormal; Notable for the following components:   Glucose, Bld 107 (*)    Calcium 8.8 (*)    All other components within normal limits  URINALYSIS, ROUTINE W REFLEX MICROSCOPIC - Abnormal; Notable for the following components:   APPearance HAZY (*)    Hgb urine dipstick TRACE (*)    Nitrite POSITIVE (*)    All other components within normal limits  HCG, QUANTITATIVE, PREGNANCY - Abnormal; Notable for the following components:   hCG, Beta Chain, Quant, S 53,879 (*)    All other components within normal limits  URINALYSIS, MICROSCOPIC (REFLEX) - Abnormal; Notable for the following components:   Bacteria, UA MANY (*)    All other components within normal limits  URINE CULTURE    EKG: None  Radiology: No results found.   Ultrasound ED OB Pelvic  Date/Time: 08/28/2024 1:01 AM  Performed by: Carita Senior, MD Authorized by: Carita Senior, MD   Procedure details:    Indications: evaluate for IUP  and pregnant with abdominal pain     Assess:  Intrauterine pregnancy   Technique:  Transabdominal obstetric (HCG+) exam   Images: archived   Study Limitations: body habitus Uterine findings:    Endometrial stripe: identified     Intrauterine pregnancy: identified     Single gestation: identified     Gestational sac: identified     Fetal pole: not identified     Fetal heart rate: not identified      Left ovary findings:    Left ovary:  Not visualized    Right  ovary findings:     Right ovary:  Not visualized    Other findings:    Free pelvic fluid: not identified     Free peritoneal fluid: not identified      Medications Ordered in the ED  cefTRIAXone  (ROCEPHIN ) 1 g in sodium chloride  0.9 % 100 mL IVPB (has no administration in time range)  lactated ringers bolus 1,000 mL (has no administration in time range)                                    Medical Decision Making Amount and/or Complexity of Data Reviewed Labs: ordered. Decision-making details documented in ED Course. Radiology: ordered and independent interpretation performed. Decision-making details documented in ED Course. ECG/medicine tests: ordered and independent interpretation performed. Decision-making details documented in ED Course.  Risk Prescription drug management.   Pregnancy with lower abdominal pain, nausea and vomiting.  Stable vital signs.  Abdomen soft but diffusely tender to lower quadrants.  No peritoneal signs.  Doubt appendicitis or cholecystitis.  Bedside ultrasound shows gestational sac without obvious IUP.  Urinalysis is positive.  Will send culture and treat with Rocephin .  IV fluids and antiemetics given.  Bedside ultrasound does show a gestational sac but no obvious IUP.  Transvaginal ultrasound not available.  Patient given IV fluids, antiemetics and IV Rocephin .  Urine culture sent.  Discussed with Dr. Zina of gynecology.  Agrees with need for transvaginal ultrasound which is not  available at this facility.  Does have gestational sac but no obvious IUP.  Discussed with patient that she needs transvaginal ultrasound to rule out ectopic pregnancy.  Her fianc will drive her to MAU  She is hemodynamically stable.  She will transfer by private vehicle which seems reasonable  Discussed go directly to MAU without any stops on the way.  Remain NPO.  Keflex  prescription sent to pharmacy for UTI.  Urine culture pending     Final diagnoses:  Nausea and vomiting during pregnancy  Acute cystitis without hematuria  Normal pregnancy in first trimester    ED Discharge Orders     None          Phillipe Clemon, Garnette, MD 08/28/24 0500

## 2024-08-30 LAB — URINE CULTURE: Culture: >=100000 — AB

## 2024-09-02 ENCOUNTER — Other Ambulatory Visit (HOSPITAL_BASED_OUTPATIENT_CLINIC_OR_DEPARTMENT_OTHER): Payer: Self-pay

## 2024-09-02 ENCOUNTER — Ambulatory Visit (HOSPITAL_COMMUNITY): Payer: Self-pay | Admitting: Emergency Medicine

## 2024-09-02 ENCOUNTER — Other Ambulatory Visit: Payer: Self-pay

## 2024-09-02 MED ORDER — CEFIXIME 400 MG PO CAPS
400.0000 mg | ORAL_CAPSULE | Freq: Every day | ORAL | 0 refills | Status: AC
  Filled 2024-09-02: qty 7, 7d supply, fill #0

## 2024-09-02 NOTE — Telephone Encounter (Signed)
 Urine culture resistant to prescribed therapy. New prescription for cefixime sent to her pharmacy

## 2024-09-05 ENCOUNTER — Other Ambulatory Visit: Payer: Self-pay

## 2024-09-05 ENCOUNTER — Emergency Department (HOSPITAL_BASED_OUTPATIENT_CLINIC_OR_DEPARTMENT_OTHER)
Admission: EM | Admit: 2024-09-05 | Discharge: 2024-09-06 | Disposition: A | Discharge status: Home / Self Care | Attending: Emergency Medicine | Admitting: Emergency Medicine

## 2024-09-05 ENCOUNTER — Encounter (HOSPITAL_BASED_OUTPATIENT_CLINIC_OR_DEPARTMENT_OTHER): Payer: Self-pay | Admitting: Emergency Medicine

## 2024-09-05 DIAGNOSIS — N9489 Other specified conditions associated with female genital organs and menstrual cycle: Secondary | ICD-10-CM | POA: Insufficient documentation

## 2024-09-05 DIAGNOSIS — N949 Unspecified condition associated with female genital organs and menstrual cycle: Secondary | ICD-10-CM

## 2024-09-05 DIAGNOSIS — L292 Pruritus vulvae: Secondary | ICD-10-CM | POA: Diagnosis present

## 2024-09-05 NOTE — ED Provider Notes (Signed)
 [facility] Provider Note   CSN: [id number] Arrival date & time: 09/05/24  2225     History Chief Complaint  Patient presents with   Medication Reaction   Vaginal Itching    HPI Emily Bennett is a 33 y.o. female presenting for chief complaint of vaginal itching. Tried a femi-clear product as she has a history of BV and typically uses these products but this time it caused substantial diffuse. Chief complaint of vaginal itching. Did not improve with her medications.  G1, P0 at approximately 7 weeks 3 days.  Has follow-up with gynecology in 2 weeks. Patient's recorded medical, surgical, social, medication list and allergies were reviewed in the Snapshot window as part of the initial history.   Review of Systems   Review of Systems  Constitutional:  Negative for chills and fever.  HENT:  Negative for ear pain and sore throat.   Eyes:  Negative for pain and visual disturbance.  Respiratory:  Negative for cough and shortness of breath.   Cardiovascular:  Negative for chest pain and palpitations.  Gastrointestinal:  Negative for abdominal pain and vomiting.  Genitourinary:  Negative for dysuria and hematuria.  Musculoskeletal:  Negative for arthralgias and back pain.  Skin:  Negative for color change and rash.  Neurological:  Negative for seizures and syncope.  All other systems reviewed and are negative.   Physical Exam Updated Vital Signs BP 116/78   Pulse 90   Temp (!) 97.1 F (36.2 C)   Resp 16   Ht 5' 3 (1.6 m)   Wt 81.6 kg   LMP 10/12/2023 (Exact Date)   SpO2 99%   BMI 31.89 kg/m  Physical Exam Vitals and nursing note reviewed.  Constitutional:      General: She is not in acute distress.    Appearance: She is well-developed.  HENT:     Head: Normocephalic and atraumatic.  Eyes:     Conjunctiva/sclera: Conjunctivae normal.  Cardiovascular:     Rate and Rhythm: Normal rate and regular rhythm.     Heart  sounds: No murmur heard. Pulmonary:     Effort: Pulmonary effort is normal. No respiratory distress.     Breath sounds: Normal breath sounds.  Abdominal:     General: There is no distension.     Palpations: Abdomen is soft.     Tenderness: There is no abdominal tenderness. There is no right CVA tenderness or left CVA tenderness.  Musculoskeletal:        General: No swelling or tenderness. Normal range of motion.     Cervical back: Neck supple.  Skin:    General: Skin is warm and dry.  Neurological:     General: No focal deficit present.     Mental Status: She is alert and oriented to person, place, and time. Mental status is at baseline.     Cranial Nerves: No cranial nerve deficit.      ED Course/ Medical Decision Making/ A&P    Procedures Procedures   Medications Ordered in ED Medications - No data to display  Medical Decision Making:   33 year old female G1, P0 at 7 weeks 3 days presenting with a chief complaint of vaginal itching and request for review of her medications. She used a over-the-counter supplements for BV.  It does not appear to have any teratogenic medications but is not studied in pregnancies have recommended the patient discontinue use of this medication until discussed with OB. Physical exam  revealed***. Wet prep completed and revealed***  Disposition:  I have considered need for hospitalization, however, considering all of the above, I believe this patient is stable for discharge at this time.  Patient/family educated about specific return precautions for given chief complaint and symptoms.  Patient/family educated about follow-up with PC.     Patient/family expressed understanding of return precautions and need for follow-up. Patient spoken to regarding all imaging and laboratory results and appropriate follow up for these results. All education provided in verbal form with additional information in written form. Time was allowed for answering of  patient questions. Patient discharged.    Emergency Department Medication Summary:   Medications - No data to display      Clinical Impression:  1. Vaginal discomfort      Data Unavailable   Final Clinical Impression(s) / ED Diagnoses Final diagnoses:  Vaginal discomfort    Rx / DC Orders ED Discharge Orders     None

## 2024-09-05 NOTE — ED Triage Notes (Signed)
 Pt c/o vaginal itching; reports she is about to complete abx for UTI; also sts she used FemiClear vaginal suppository for BV yesterday; she is pregnant and read today that it is contraindicated in pregnancy

## 2024-09-06 LAB — URINALYSIS, ROUTINE W REFLEX MICROSCOPIC
Bilirubin Urine: NEGATIVE
Glucose, UA: NEGATIVE mg/dL
Hgb urine dipstick: NEGATIVE
Ketones, ur: NEGATIVE mg/dL
Leukocytes,Ua: NEGATIVE
Nitrite: NEGATIVE
Protein, ur: NEGATIVE mg/dL
Specific Gravity, Urine: 1.02 (ref 1.005–1.030)
pH: 6 (ref 5.0–8.0)

## 2024-09-06 LAB — WET PREP, GENITAL
Sperm: NONE SEEN
Trich, Wet Prep: NONE SEEN
WBC, Wet Prep HPF POC: <10 (ref <10)
Yeast Wet Prep HPF POC: NONE SEEN

## 2024-09-06 MED ORDER — METRONIDAZOLE 0.75 % VA GEL: 1.0000 | Freq: Every day | VAGINAL | 0 refills | Status: AC

## 2024-09-06 MED ORDER — METRONIDAZOLE 500 MG PO TABS
500.0000 mg | ORAL_TABLET | Freq: Once | ORAL | Status: AC
  Administered 2024-09-06: 500 mg via ORAL
  Filled 2024-09-06: qty 1

## 2024-09-06 NOTE — ED Notes (Signed)
 ED Provider at bedside for pelvic exam. This RN at bedside for buddy care.

## 2024-09-06 NOTE — ED Notes (Signed)
 Pt medicated per emar. Dc instructions given, pt verbalized understanding, out of ED with steady gait, not in visible distress.

## 2024-09-07 LAB — GC/CHLAMYDIA PROBE AMP (~~LOC~~) NOT AT ARMC
Chlamydia: NEGATIVE
Comment: NEGATIVE
Comment: NORMAL
Neisseria Gonorrhea: NEGATIVE

## 2024-09-17 ENCOUNTER — Other Ambulatory Visit (HOSPITAL_BASED_OUTPATIENT_CLINIC_OR_DEPARTMENT_OTHER): Payer: Self-pay

## 2024-11-06 ENCOUNTER — Other Ambulatory Visit: Payer: Self-pay

## 2024-11-06 ENCOUNTER — Emergency Department (HOSPITAL_BASED_OUTPATIENT_CLINIC_OR_DEPARTMENT_OTHER)
Admission: EM | Admit: 2024-11-06 | Discharge: 2024-11-06 | Disposition: A | Discharge status: Home / Self Care | Attending: Emergency Medicine | Admitting: Emergency Medicine

## 2024-11-06 DIAGNOSIS — Z3A17 17 weeks gestation of pregnancy: Secondary | ICD-10-CM | POA: Insufficient documentation

## 2024-11-06 DIAGNOSIS — G44209 Tension-type headache, unspecified, not intractable: Secondary | ICD-10-CM | POA: Diagnosis not present

## 2024-11-06 DIAGNOSIS — Z3492 Encounter for supervision of normal pregnancy, unspecified, second trimester: Secondary | ICD-10-CM

## 2024-11-06 DIAGNOSIS — R030 Elevated blood-pressure reading, without diagnosis of hypertension: Secondary | ICD-10-CM | POA: Diagnosis not present

## 2024-11-06 DIAGNOSIS — O99352 Diseases of the nervous system complicating pregnancy, second trimester: Secondary | ICD-10-CM | POA: Insufficient documentation

## 2024-11-06 DIAGNOSIS — O26892 Other specified pregnancy related conditions, second trimester: Secondary | ICD-10-CM | POA: Diagnosis present

## 2024-11-06 LAB — CBC WITH DIFFERENTIAL/PLATELET
Abs Immature Granulocytes: 0.04 K/uL (ref 0.00–0.07)
Basophils Absolute: 0 K/uL (ref 0.0–0.1)
Basophils Relative: 0 %
Eosinophils Absolute: 0.1 K/uL (ref 0.0–0.5)
Eosinophils Relative: 1 %
HCT: 37.1 % (ref 36.0–46.0)
Hemoglobin: 12.4 g/dL (ref 12.0–15.0)
Immature Granulocytes: 1 %
Lymphocytes Relative: 22 %
Lymphs Abs: 1.9 K/uL (ref 0.7–4.0)
MCH: 27.2 pg (ref 26.0–34.0)
MCHC: 33.4 g/dL (ref 30.0–36.0)
MCV: 81.4 fL (ref 80.0–100.0)
Monocytes Absolute: 0.6 K/uL (ref 0.1–1.0)
Monocytes Relative: 7 %
Neutro Abs: 6.1 K/uL (ref 1.7–7.7)
Neutrophils Relative %: 69 %
Platelets: 274 K/uL (ref 150–400)
RBC: 4.56 MIL/uL (ref 3.87–5.11)
RDW: 13.2 % (ref 11.5–15.5)
WBC: 8.8 K/uL (ref 4.0–10.5)
nRBC: 0 % (ref 0.0–0.2)

## 2024-11-06 LAB — URINALYSIS, ROUTINE W REFLEX MICROSCOPIC
Bilirubin Urine: NEGATIVE
Glucose, UA: NEGATIVE mg/dL
Hgb urine dipstick: NEGATIVE
Ketones, ur: 15 mg/dL — AB
Nitrite: NEGATIVE
Protein, ur: NEGATIVE mg/dL
Specific Gravity, Urine: 1.02 (ref 1.005–1.030)
pH: 6.5 (ref 5.0–8.0)

## 2024-11-06 LAB — URINALYSIS, MICROSCOPIC (REFLEX)

## 2024-11-06 LAB — COMPREHENSIVE METABOLIC PANEL WITH GFR
ALT: 7 U/L (ref 0–44)
AST: 17 U/L (ref 15–41)
Albumin: 4 g/dL (ref 3.5–5.0)
Alkaline Phosphatase: 57 U/L (ref 38–126)
Anion gap: 12 (ref 5–15)
BUN: 10 mg/dL (ref 6–20)
CO2: 24 mmol/L (ref 22–32)
Calcium: 9.4 mg/dL (ref 8.9–10.3)
Chloride: 101 mmol/L (ref 98–111)
Creatinine, Ser: 0.72 mg/dL (ref 0.44–1.00)
GFR, Estimated: >60 mL/min (ref >60)
Glucose, Bld: 91 mg/dL (ref 70–99)
Potassium: 3.7 mmol/L (ref 3.5–5.1)
Sodium: 136 mmol/L (ref 135–145)
Total Bilirubin: 0.3 mg/dL (ref 0.0–1.2)
Total Protein: 7.5 g/dL (ref 6.5–8.1)

## 2024-11-06 LAB — MAGNESIUM: Magnesium: 1.8 mg/dL (ref 1.7–2.4)

## 2024-11-06 LAB — HCG, QUANTITATIVE, PREGNANCY: hCG, Beta Chain, Quant, S: 45211 m[IU]/mL — ABNORMAL HIGH (ref <5)

## 2024-11-06 MED ORDER — ONDANSETRON 4 MG PO TBDP
4.0000 mg | ORAL_TABLET | Freq: Once | ORAL | Status: AC
  Administered 2024-11-06: 4 mg via ORAL
  Filled 2024-11-06: qty 1

## 2024-11-06 MED ORDER — ONDANSETRON 4 MG PO TBDP: 4.0000 mg | ORAL_TABLET | Freq: Three times a day (TID) | ORAL | 0 refills | Status: AC | PRN

## 2024-11-06 NOTE — Discharge Instructions (Signed)
 Follow-up with your OB/GYN as scheduled.  Take Tylenol  only as needed for headache.  Increase your fluid intake to prevent dehydration.

## 2024-11-06 NOTE — ED Provider Notes (Signed)
°  [place] Provider Note   CSN: [id number] Arrival date & time: 11/06/24  2107     Patient presents with: Hypertension   Emily Bennett is a 33 y.o. female.  With primary concern of hypertension in pregnancy.  She states that she is [redacted] weeks pregnant, following with OB/GYN, had a high blood pressure reading at home and is concern for preeclampsia.  She has no increased urination or change in appearance of urine, no frothy urine.  States that she does not have any dysuria or pelvic cramping.  Has had some intermittent nausea associated with pregnancy otherwise has been using ondansetron  with some effect in managing her nausea.  Otherwise no visual changes, no dizziness, intermittent headaches that is described as bandlike global.  {Add pertinent medical, surgical, social history, OB history to HPI:32947}  Hypertension       Prior to Admission medications  Medication Sig Start Date End Date Taking? Authorizing Provider  metoCLOPramide  (REGLAN ) 10 MG tablet Take 1 tablet (10 mg total) by mouth every 8 (eight) hours as needed for nausea or vomiting. 08/28/24   Trudy Leeroy NOVAK, MD  cetirizine  (ZYRTEC ) 10 MG tablet Take 1 tablet (10 mg total) by mouth daily. 11/08/17 11/24/20  Caccavale, Sophia, PA-C    Allergies: Patient has no known allergies.    Review of Systems  Updated Vital Signs BP 125/73 (BP Location: Right Arm)   Pulse 84   Temp 98 F (36.7 C) (Oral)   Resp 18   LMP 10/12/2023 (Exact Date)   SpO2 100%   Physical Exam  (all labs ordered are listed, but only abnormal results are displayed) Labs Reviewed  URINALYSIS, ROUTINE W REFLEX MICROSCOPIC - Abnormal; Notable for the following components:      Result Value   APPearance CLOUDY (*)    Ketones, ur 15 (*)    Leukocytes,Ua TRACE (*)    All other components within normal limits  HCG, QUANTITATIVE, PREGNANCY - Abnormal; Notable for the following components:   hCG,  Beta Chain, Quant, S 45,211 (*)    All other components within normal limits  URINALYSIS, MICROSCOPIC (REFLEX) - Abnormal; Notable for the following components:   Bacteria, UA MANY (*)    All other components within normal limits  CBC WITH DIFFERENTIAL/PLATELET  COMPREHENSIVE METABOLIC PANEL WITH GFR  MAGNESIUM    EKG: None  Radiology: No results found.  {Document cardiac monitor, telemetry assessment procedure when appropriate:32947} Procedures   Medications Ordered in the ED - No data to display    {Click here for ABCD2, HEART and other calculators REFRESH Note before signing:1}                              Medical Decision Making Amount and/or Complexity of Data Reviewed Labs: ordered.   ***  {Document critical care time when appropriate  Document review of labs and clinical decision tools ie CHADS2VASC2, etc  Document your independent review of radiology images and any outside records  Document your discussion with family members, caretakers and with consultants  Document social determinants of health affecting pt's care  Document your decision making why or why not admission, treatments were needed:32947:::1}   Final diagnoses:  None    ED Discharge Orders     None

## 2024-11-06 NOTE — ED Notes (Signed)
 Pt actively vomiting. Provider notified and pt will get medication prior to d/c.

## 2024-11-06 NOTE — ED Triage Notes (Addendum)
 Pt she reports she is 4 months pregnant.  Has been having n/v  which has gotten better the past month. Today has noticed high BP of 165 systolic. And low 100s diastolic
